# Patient Record
Sex: Female | Born: 1975
Health system: Southern US, Community
[De-identification: ages and names within clinical notes are randomized; demographics above are authoritative.]

## PROBLEM LIST (undated history)

## (undated) DIAGNOSIS — Z950 Presence of cardiac pacemaker: Secondary | ICD-10-CM

## (undated) DIAGNOSIS — M519 Unspecified thoracic, thoracolumbar and lumbosacral intervertebral disc disorder: Secondary | ICD-10-CM

## (undated) HISTORY — DX: Presence of cardiac pacemaker: Z95.0

## (undated) HISTORY — DX: Unspecified thoracic, thoracolumbar and lumbosacral intervertebral disc disorder: M51.9

## (undated) HISTORY — PX: MOHS SURGERY: SUR867

---

## 1998-10-26 ENCOUNTER — Other Ambulatory Visit: Admission: RE | Admit: 1998-10-26 | Discharge: 1998-10-26 | Payer: Self-pay | Admitting: Gynecology

## 1999-10-04 ENCOUNTER — Other Ambulatory Visit: Admission: RE | Admit: 1999-10-04 | Discharge: 1999-10-04 | Payer: Self-pay | Admitting: Gynecology

## 2000-10-11 ENCOUNTER — Other Ambulatory Visit: Admission: RE | Admit: 2000-10-11 | Discharge: 2000-10-11 | Payer: Self-pay | Admitting: Gynecology

## 2001-10-06 ENCOUNTER — Other Ambulatory Visit: Admission: RE | Admit: 2001-10-06 | Discharge: 2001-10-06 | Payer: Self-pay | Admitting: Gynecology

## 2002-10-22 ENCOUNTER — Other Ambulatory Visit: Admission: RE | Admit: 2002-10-22 | Discharge: 2002-10-22 | Payer: Self-pay | Admitting: Gynecology

## 2003-11-09 ENCOUNTER — Other Ambulatory Visit: Admission: RE | Admit: 2003-11-09 | Discharge: 2003-11-09 | Payer: Self-pay | Admitting: Gynecology

## 2004-11-09 ENCOUNTER — Other Ambulatory Visit: Admission: RE | Admit: 2004-11-09 | Discharge: 2004-11-09 | Payer: Self-pay | Admitting: Gynecology

## 2006-03-06 ENCOUNTER — Other Ambulatory Visit: Admission: RE | Admit: 2006-03-06 | Discharge: 2006-03-06 | Payer: Self-pay | Admitting: Gynecology

## 2011-07-27 ENCOUNTER — Ambulatory Visit (HOSPITAL_COMMUNITY)
Admission: RE | Admit: 2011-07-27 | Discharge: 2011-07-27 | Disposition: A | Discharge status: Home / Self Care | Payer: BC Managed Care – PPO | Source: Ambulatory Visit | Attending: Orthopedic Surgery | Admitting: Orthopedic Surgery

## 2011-07-27 ENCOUNTER — Other Ambulatory Visit: Payer: Self-pay | Admitting: Orthopedic Surgery

## 2011-07-27 ENCOUNTER — Ambulatory Visit (HOSPITAL_COMMUNITY): Admission: RE | Admit: 2011-07-27 | Payer: BC Managed Care – PPO | Source: Ambulatory Visit

## 2011-07-27 DIAGNOSIS — R52 Pain, unspecified: Secondary | ICD-10-CM

## 2011-07-27 DIAGNOSIS — M25579 Pain in unspecified ankle and joints of unspecified foot: Secondary | ICD-10-CM | POA: Insufficient documentation

## 2013-03-24 ENCOUNTER — Ambulatory Visit (HOSPITAL_COMMUNITY)
Admission: RE | Admit: 2013-03-24 | Discharge: 2013-03-24 | Disposition: A | Discharge status: Home / Self Care | Payer: 59 | Source: Ambulatory Visit | Attending: Podiatry | Admitting: Podiatry

## 2013-03-24 DIAGNOSIS — M25673 Stiffness of unspecified ankle, not elsewhere classified: Secondary | ICD-10-CM | POA: Insufficient documentation

## 2013-03-24 DIAGNOSIS — M25579 Pain in unspecified ankle and joints of unspecified foot: Secondary | ICD-10-CM | POA: Insufficient documentation

## 2013-03-24 DIAGNOSIS — M6281 Muscle weakness (generalized): Secondary | ICD-10-CM | POA: Insufficient documentation

## 2013-03-24 DIAGNOSIS — M25676 Stiffness of unspecified foot, not elsewhere classified: Secondary | ICD-10-CM | POA: Insufficient documentation

## 2013-03-24 DIAGNOSIS — M6790 Unspecified disorder of synovium and tendon, unspecified site: Secondary | ICD-10-CM | POA: Insufficient documentation

## 2013-03-24 DIAGNOSIS — IMO0001 Reserved for inherently not codable concepts without codable children: Secondary | ICD-10-CM | POA: Insufficient documentation

## 2013-03-24 NOTE — Evaluation (Signed)
Physical Therapy Evaluation  Patient Details  Name: Latasha Allen MRN: [id number] Date of Birth: 05-29-1976 Charge:  evaluation Today's Date: 03/24/2013 Time: 7829-5621 PT Time Calculation (min): 40 min              Visit#: 1 of 8  Re-eval: 04/23/13 Assessment Diagnosis: B plantar fascitis Prior Therapy: none  Authorization:      Authorization Time Period:    Authorization Visit#:   of     Past Medical History: No past medical history on file. Past Surgical History: No past surgical history on file.  Subjective Symptoms/Limitations Symptoms: Ms. Meares states that she has been having plantar fasciitis off and on for over a year.  She states that both of her feet are sore all over.  She has noticed swelling but does not wear compression garment. She states that the pain is worst in the mornig and if she stands in one position.  Pt states that she has been injected three times with tempporary relief that lasts for about two months. How long can you stand comfortably?: pain increases after 20-30 minues  Pain Assessment Currently in Pain?: Yes Pain Score:   4 (worst pain is at 7/10; best 2/10) Pain Location: Foot Pain Orientation: Right;Left Pain Relieving Factors: nothing Effect of Pain on Daily Activities: increases pain  Balance Screening  no falls   Sensation/Coordination/Flexibility/Functional Tests  LEFS 72/80  Assessment RLE AROM (degrees) Right Ankle Dorsiflexion: 5 Right Ankle Plantar Flexion:  (wnl) Right Ankle Inversion:  (wnl) Right Ankle Eversion:  (wnl) RLE Strength Right Ankle Dorsiflexion: 5/5 LLE AROM (degrees) Left Ankle Dorsiflexion: 10 Left Ankle Plantar Flexion:  (wnl) Left Ankle Inversion:  (wnl) Left Ankle Eversion:  (wnl)  Exercise/Treatments   Ankle Stretches Plantar Fascia Stretch: 3 reps;30 seconds Gastroc Stretch: 3 reps;30 seconds   Manual Therapy Manual Therapy: Myofascial release Myofascial Release: B gastro/soleus complex as  well as plantar aspect of feet.  Physical Therapy Assessment and Plan PT Assessment and Plan Clinical Impression Statement: Patient with chronic plantarfascitis B who has been referred to PT to help to decrease sx of pain.   Exam reveals decreased dorsiflexion B as well as fascial restrictions in B gastroc/soleus complex.  Pt will benefit from skilled PT to increase ROM, decrease pain and return pt to prior functional level. Rehab Potential: Good PT Frequency: Min 2X/week PT Treatment/Interventions: Therapeutic exercise;Manual techniques;Modalities PT Plan: begin [place] to B gastroc soleus tendon.    Goals Home Exercise Program Pt will Perform Home Exercise Program: Independently PT Short Term Goals Time to Complete Short Term Goals: 2 weeks PT Short Term Goal 1: Pain no higher than a 4/10 PT Short Term Goal 2: dorsiflexion wnl PT Short Term Goal 3: Pt to be able to stand for 90 minutes without increased pain PT Long Term Goals Time to Complete Long Term Goals: 4 weeks PT Long Term Goal 1: Pain no higher than a 1/10 PT Long Term Goal 2: Pt to be able to stand for 3 hours without increased pain  Problem List Patient Active Problem List   Diagnosis Date Noted  . Pain in joint, ankle and foot 03/24/2013  . Tendon dysfunction 03/24/2013    PT Plan of Care PT Home Exercise Plan: GIven  GP    Maison Agrusa,CINDY 03/24/2013, 4:22 PM  Physician Documentation Your signature is required to indicate approval of the treatment plan as stated above.  Please sign and either send electronically or make a copy of this report for your  files and return this physician signed original.   Please mark one 1.__approve of plan  2. ___approve of plan with the following conditions.   ______________________________                                                          _____________________ Physician Signature                                                                                                              Date

## 2013-04-01 ENCOUNTER — Ambulatory Visit (HOSPITAL_COMMUNITY)
Admission: RE | Admit: 2013-04-01 | Discharge: 2013-04-01 | Disposition: A | Discharge status: Home / Self Care | Payer: 59 | Source: Ambulatory Visit | Attending: Podiatry | Admitting: Podiatry

## 2013-04-01 DIAGNOSIS — M6790 Unspecified disorder of synovium and tendon, unspecified site: Secondary | ICD-10-CM

## 2013-04-01 NOTE — Progress Notes (Signed)
Physical Therapy Treatment Patient Details  Name: Latasha Allen MRN: [id number] Date of Birth: 1976-07-30  Today's Date: 04/01/2013 Time: 1530-1610 PT Time Calculation (min): 40 min  Visit#: 2 of 8  Re-eval: 04/23/13    Authorization: UMR  Subjective: Symptoms/Limitations Symptoms: Latasha Allen has acquired her compression stockings and feel that they may be helping somewhat.   Pain Assessment Currently in Pain?: Yes Pain Score:   4 Pain Location: Foot Pain Orientation: Right;Left Pain Onset: More than a month ago Pain Frequency: Constant  Precautions/Restrictions     Exercise/Treatments Ankle Stretches Soleus Stretch: 3 reps;30 seconds Other Stretch: slant board 3x 30 "   Modalities Modalities: Ultrasound Manual Therapy Manual Therapy: Myofascial release Myofascial Release: B gastroc soleus complex as well as plantar aspect of feet to decrease adhesions to decrease pain. Ultrasound Ultrasound Location: B Gastroc tendon  Ultrasound Parameters: 3 MHZ 1.2 w/cm2 x 5' each Ultrasound Goals: Pain (increase circulation)  Physical Therapy Assessment and Plan PT Assessment and Plan Clinical Impression Statement: Pt with improving ROM and pain.  Added [place] to improve pain and circulation to tendon. PT Frequency: Min 2X/week PT Treatment/Interventions: Therapeutic exercise;Manual techniques;Modalities PT Plan: Assess how [place] did; may change to laser if pt is not seeing results.    Goals    Problem List Patient Active Problem List   Diagnosis Date Noted  . Pain in joint, ankle and foot 03/24/2013  . Tendon dysfunction 03/24/2013       GP    RUSSELL,CINDY 04/01/2013, 5:28 PM

## 2013-04-03 ENCOUNTER — Ambulatory Visit (HOSPITAL_COMMUNITY)
Admission: RE | Admit: 2013-04-03 | Discharge: 2013-04-03 | Disposition: A | Discharge status: Home / Self Care | Payer: 59 | Source: Ambulatory Visit | Attending: Podiatry | Admitting: Podiatry

## 2013-04-03 DIAGNOSIS — M6790 Unspecified disorder of synovium and tendon, unspecified site: Secondary | ICD-10-CM

## 2013-04-03 NOTE — Progress Notes (Signed)
Physical Therapy Treatment Patient Details  Name: Latasha Allen MRN: [id number] Date of Birth: 01/22/1976  Today's Date: 04/03/2013 Time: 1510-1607 PT Time Calculation (min): 57 min  Visit#: 3 of 8  Re-eval: 04/23/13  charges:  There ex 3:10 -316; Manual 3:16- 3:36; [place] 3:38-4:04  Authorization: UMR  Subjective: Symptoms/Limitations Symptoms: Pt states that she had temporary relief from last treatment.   Pain Assessment Pain Score:   3 Pain Location: Foot Pain Orientation: Right;Left Pain Onset: More than a month ago    Exercise/Treatments  Ankle Stretches Plantar Fascia Stretch: 3 reps;30 seconds Other Stretch: slant board 3x 30 "   Modalities Modalities: Ultrasound Manual Therapy Manual Therapy: Myofascial release Myofascial Release: manual and hawks tools used to decrease adhesion in gastroc/soleus complex. Ultrasound Ultrasound Parameters: at 1.2 w/cm2 x 8" each. Ultrasound Goals: Pain  Physical Therapy Assessment and Plan PT Assessment and Plan Clinical Impression Statement: Pt continues to have pain in the plantar aspect of B feet. States she feels that the gastroc mm itself is not as tender as it has been.  Rehab Potential: Good PT Frequency: Min 2X/week PT Treatment/Interventions: Therapeutic exercise;Manual techniques;Modalities PT Plan: Coninue one more treatment of [place] prior to deciding to try another modality.    Goals  progressing  Problem List Patient Active Problem List   Diagnosis Date Noted  . Pain in joint, ankle and foot 03/24/2013  . Tendon dysfunction 03/24/2013       GP    RUSSELL,CINDY 04/03/2013, 4:23 PM

## 2013-04-08 ENCOUNTER — Ambulatory Visit (HOSPITAL_COMMUNITY)
Admission: RE | Admit: 2013-04-08 | Discharge: 2013-04-08 | Disposition: A | Discharge status: Home / Self Care | Payer: 59 | Source: Ambulatory Visit | Attending: Podiatry | Admitting: Podiatry

## 2013-04-08 DIAGNOSIS — M25579 Pain in unspecified ankle and joints of unspecified foot: Secondary | ICD-10-CM | POA: Insufficient documentation

## 2013-04-08 DIAGNOSIS — M6790 Unspecified disorder of synovium and tendon, unspecified site: Secondary | ICD-10-CM

## 2013-04-08 DIAGNOSIS — M6281 Muscle weakness (generalized): Secondary | ICD-10-CM | POA: Insufficient documentation

## 2013-04-08 DIAGNOSIS — M25673 Stiffness of unspecified ankle, not elsewhere classified: Secondary | ICD-10-CM | POA: Insufficient documentation

## 2013-04-08 DIAGNOSIS — M25676 Stiffness of unspecified foot, not elsewhere classified: Secondary | ICD-10-CM | POA: Insufficient documentation

## 2013-04-08 DIAGNOSIS — IMO0001 Reserved for inherently not codable concepts without codable children: Secondary | ICD-10-CM | POA: Insufficient documentation

## 2013-04-08 NOTE — Progress Notes (Signed)
Physical Therapy Treatment Patient Details  Name: Latasha Allen MRN: [id number] Date of Birth: 1976-01-14  Today's Date: 04/08/2013 Time: 4098-1191 PT Time Calculation (min): 50 min Charge:Therex 12' [phone]), [place] 21' [phone]), Manual 21' [phone])  Visit#: 4 of 8  Re-eval: 04/23/13   Subjective: Symptoms/Limitations Symptoms: Pt stated she is finding increased pain relief following [place], was on feet alot yesterday and today.   Pain Assessment Currently in Pain?: Yes Pain Score:   6 Pain Location: Foot Pain Orientation: Right;Left  Objective:    Exercise/Treatments Ankle Stretches Plantar Fascia Stretch: 3 reps;30 seconds Other Stretch: slant board 3x 30 "    Modalities Modalities: Ultrasound Manual Therapy Manual Therapy: Myofascial release Myofascial Release: Manual and hawk grips tools used to decrease adhesions in plantar surface, gastoc and soleus complex. Ultrasound Ultrasound Location: B gastroc tendon, achillles and plantar surface Ultrasound Parameters: at 1.2 w/cm2 continuous x 8" each (16' total) Ultrasound Goals: Pain  Physical Therapy Assessment and Plan PT Assessment and Plan Clinical Impression Statement: Continued with [place] for pain relief and manual techniques to reduce tightness with significant reduction in fascial restrictions especially Rt plantar surface and Lt medial gastrocnemius.  Pt educated on importance of stretches with plantarfascia and encouiraged to increase frequency of stretches at home for maximum benefits/ PT Plan: Assess pain relief following this session, f/u with [place] to decide if needed to try another modality.    Goals    Problem List Patient Active Problem List   Diagnosis Date Noted  . Pain in joint, ankle and foot 03/24/2013  . Tendon dysfunction 03/24/2013    PT - End of Session Activity Tolerance: Patient tolerated treatment well General Behavior During Therapy: WFL for tasks assessed/performed Cognition:  WFL for tasks performed PT Plan of Care PT Patient Instructions: Explained importance and encouraged increased frequency of stretches.    GP    Juel Burrow 04/08/2013, 5:10 PM

## 2013-04-10 ENCOUNTER — Ambulatory Visit (HOSPITAL_COMMUNITY)
Admission: RE | Admit: 2013-04-10 | Discharge: 2013-04-10 | Disposition: A | Discharge status: Home / Self Care | Payer: 59 | Source: Ambulatory Visit | Attending: Podiatry | Admitting: Podiatry

## 2013-04-10 DIAGNOSIS — M6790 Unspecified disorder of synovium and tendon, unspecified site: Secondary | ICD-10-CM

## 2013-04-10 NOTE — Progress Notes (Signed)
Physical Therapy Treatment Patient Details  Name: Latasha Allen MRN: [id number] Date of Birth: Oct 07, 1976  Today's Date: 04/10/2013 Time: 6578-4696 PT Time Calculation (min): 43 min  Visit#: 5 of 8  Re-eval: 04/23/13  charge:  There ex [id number]; [place] [id number]; ;manual 1400-1413.  Authorization: UMR   Subjective: Symptoms/Limitations Symptoms: Pt has no pain today Pain Assessment Currently in Pain?: No/denies Pain Score: 0-No pain  Exercise/Treatments   Ankle Stretches Plantar Fascia Stretch: 3 reps;30 seconds Other Stretch: slant board 3x 30 " Other Stretch: foot against wall x 30 sec Modalities Modalities: Ultrasound Manual Therapy Manual Therapy: Myofascial release Myofascial Release: manual and hawk grips tools used to decrease adhesions in plantar surface/gastroc. Ultrasound Ultrasound Location: B gastoc tendon while on stretch to increase flexibility Ultrasound Parameters: at 1.2 w/cm2 x 8 min each  Physical Therapy Assessment and Plan PT Assessment and Plan Clinical Impression Statement: Pt with decreased tightness and adhesions this session. Pt continues to be encouraged to increase the frequency of stretching Rehab Potential: Good PT Plan: Pt continues to improve.  Continue with plan of care.    Goals  progressing  Problem List Patient Active Problem List   Diagnosis Date Noted  . Pain in joint, ankle and foot 03/24/2013  . Tendon dysfunction 03/24/2013    PT - End of Session Activity Tolerance: Patient tolerated treatment well General Behavior During Therapy: [facility] for tasks assessed/performed Cognition: WFL for tasks performed  GP    RUSSELL,CINDY 04/10/2013, 4:52 PM

## 2013-04-13 ENCOUNTER — Inpatient Hospital Stay (HOSPITAL_COMMUNITY): Admission: RE | Admit: 2013-04-13 | Payer: BC Managed Care – PPO | Source: Ambulatory Visit | Admitting: *Deleted

## 2013-04-15 ENCOUNTER — Ambulatory Visit (HOSPITAL_COMMUNITY)
Admission: RE | Admit: 2013-04-15 | Discharge: 2013-04-15 | Disposition: A | Discharge status: Home / Self Care | Payer: 59 | Source: Ambulatory Visit | Attending: Podiatry | Admitting: Podiatry

## 2013-04-15 NOTE — Progress Notes (Signed)
Physical Therapy Treatment Patient Details  Name: Latasha Allen MRN: [id number] Date of Birth: 23-May-1976  Today's Date: 04/15/2013 Time: 1520-1600 PT Time Calculation (min): 40 min  Visit#: 6 of 8  Re-eval: 04/23/13 Charges: Therex x 10' Manual x 12' Ultrasound x 16'  Authorization: UMR    Subjective: Symptoms/Limitations Symptoms: Pt states that her pain is decreasing. Pain Assessment Currently in Pain?: Yes Pain Score:   5 Pain Location: Foot Pain Orientation: Right;Left   Exercise/Treatments Ankle Stretches Plantar Fascia Stretch: 3 reps;30 seconds Other Stretch: slant board 3x 30 "   Modalities Modalities: Ultrasound Manual Therapy Manual Therapy: Other (comment) Myofascial Release: to bilateral plantar surface/gastroc Other Manual Therapy: Strain counter strain to bilateral gastrocs  Ultrasound Ultrasound Location: B gastoc tendon while on stretch to increase flexibility Ultrasound Parameters: at 1.2 w/cm2 x 8 min  Ultrasound Goals: Pain  Physical Therapy Assessment and Plan PT Assessment and Plan Clinical Impression Statement: Decreased tightness and adhesions noted in BLE. Right gastroc presents with greater tightness that left. Completed strain counter strain to bilateral gastrocs to decrease tenderness and spasms. Pt reports pain decrease to 2/10 at end of session. Rehab Potential: Good PT Plan: Continue with modalities and manual technique to decrease pain and tightness.     Problem List Patient Active Problem List   Diagnosis Date Noted  . Pain in joint, ankle and foot 03/24/2013  . Tendon dysfunction 03/24/2013    PT - End of Session Activity Tolerance: Patient tolerated treatment well General Behavior During Therapy: [facility] for tasks assessed/performed Cognition: [facility] for tasks performed  Seth Bake, PTA  04/15/2013, 5:51 PM

## 2013-04-16 ENCOUNTER — Ambulatory Visit (HOSPITAL_COMMUNITY)
Admission: RE | Admit: 2013-04-16 | Discharge: 2013-04-16 | Disposition: A | Discharge status: Home / Self Care | Payer: 59 | Source: Ambulatory Visit | Attending: Podiatry | Admitting: Podiatry

## 2013-04-16 DIAGNOSIS — M6790 Unspecified disorder of synovium and tendon, unspecified site: Secondary | ICD-10-CM

## 2013-04-16 DIAGNOSIS — M25572 Pain in left ankle and joints of left foot: Secondary | ICD-10-CM

## 2013-04-16 NOTE — Progress Notes (Signed)
Physical Therapy Treatment Patient Details  Name: Latasha Allen MRN: [id number] Date of Birth: 1976/07/12  Today's Date: 04/16/2013 Time: 4098-1191 PT Time Calculation (min): 47 min Charges: TE: 4782-9562 [place]: 1308-6578 Manual: 4696-2952 Visit#: 7 of 8  Re-eval: 04/23/13    Authorization: UMR  Authorization Time Period:    Authorization Visit#:   of     Subjective: Symptoms/Limitations Symptoms: Pt rpeorts that she is happy with the progress.  Feels that her Rt leg is worse than her Lt.  Pain Assessment Currently in Pain?: Yes Pain Location: Foot Pain Orientation: Right;Left  Precautions/Restrictions     Exercise/Treatments Ankle Stretches Plantar Fascia Stretch: 3 reps;30 seconds Other Stretch: slant board 3x 30 " before manual, 2x60 sec after manual   Modalities Modalities: Ultrasound Manual Therapy Myofascial Release: to bilateral plantar surface/gastroc to improve fascial mobility  Other Manual Therapy: Strain counter strain to bilateral gastrocs  Ultrasound Ultrasound Location: Bil plantar fascia insertion  Ultrasound Parameters: (medium head) at 1.2 w/cm2 x 8 min (4 min Rt/4 min Lt)  Physical Therapy Assessment and Plan PT Assessment and Plan Clinical Impression Statement: Pt continues to improve her overall fascial mobility to her LLE and is still limited by fascial restrictions and muscle spasms to RLE.  Added stretching after manual therapy to improve fascial mobility to decrease overall pain and improve function.  Pt continues to progress towards functional goals and is standing longer with less pain at work.  Rehab Potential: Good PT Plan: Re-eval next visit    Goals Home Exercise Program Pt will Perform Home Exercise Program: Independently PT Short Term Goals Time to Complete Short Term Goals: 2 weeks PT Short Term Goal 1: Pain no higher than a 4/10 PT Short Term Goal 1 - Progress: Progressing toward goal PT Short Term Goal 2: dorsiflexion  wnl PT Short Term Goal 2 - Progress: Met PT Short Term Goal 3: Pt to be able to stand for 90 minutes without increased pain PT Short Term Goal 3 - Progress: Progressing toward goal PT Long Term Goals Time to Complete Long Term Goals: 4 weeks PT Long Term Goal 1: Pain no higher than a 1/10 PT Long Term Goal 1 - Progress: Progressing toward goal PT Long Term Goal 2: Pt to be able to stand for 3 hours without increased pain PT Long Term Goal 2 - Progress: Progressing toward goal  Problem List Patient Active Problem List   Diagnosis Date Noted  . Pain in joint, ankle and foot 03/24/2013  . Tendon dysfunction 03/24/2013    PT - End of Session Activity Tolerance: Patient tolerated treatment well General Behavior During Therapy: [facility] for tasks assessed/performed Cognition: WFL for tasks performed  GP    Riona Lahti 04/16/2013, 3:17 PM

## 2015-11-10 DIAGNOSIS — R635 Abnormal weight gain: Secondary | ICD-10-CM | POA: Diagnosis not present

## 2015-11-10 DIAGNOSIS — N951 Menopausal and female climacteric states: Secondary | ICD-10-CM | POA: Diagnosis not present

## 2015-11-10 DIAGNOSIS — H9201 Otalgia, right ear: Secondary | ICD-10-CM | POA: Diagnosis not present

## 2015-11-16 DIAGNOSIS — E7800 Pure hypercholesterolemia, unspecified: Secondary | ICD-10-CM | POA: Diagnosis not present

## 2015-11-16 DIAGNOSIS — E559 Vitamin D deficiency, unspecified: Secondary | ICD-10-CM | POA: Diagnosis not present

## 2015-11-16 DIAGNOSIS — E669 Obesity, unspecified: Secondary | ICD-10-CM | POA: Diagnosis not present

## 2015-11-16 DIAGNOSIS — E538 Deficiency of other specified B group vitamins: Secondary | ICD-10-CM | POA: Diagnosis not present

## 2015-11-21 DIAGNOSIS — E538 Deficiency of other specified B group vitamins: Secondary | ICD-10-CM | POA: Diagnosis not present

## 2015-11-21 DIAGNOSIS — E559 Vitamin D deficiency, unspecified: Secondary | ICD-10-CM | POA: Diagnosis not present

## 2015-11-21 DIAGNOSIS — E669 Obesity, unspecified: Secondary | ICD-10-CM | POA: Diagnosis not present

## 2015-11-21 DIAGNOSIS — E7800 Pure hypercholesterolemia, unspecified: Secondary | ICD-10-CM | POA: Diagnosis not present

## 2015-11-28 DIAGNOSIS — E538 Deficiency of other specified B group vitamins: Secondary | ICD-10-CM | POA: Diagnosis not present

## 2015-11-28 DIAGNOSIS — E669 Obesity, unspecified: Secondary | ICD-10-CM | POA: Diagnosis not present

## 2015-11-28 DIAGNOSIS — E7800 Pure hypercholesterolemia, unspecified: Secondary | ICD-10-CM | POA: Diagnosis not present

## 2015-11-28 DIAGNOSIS — E559 Vitamin D deficiency, unspecified: Secondary | ICD-10-CM | POA: Diagnosis not present

## 2015-12-05 DIAGNOSIS — E559 Vitamin D deficiency, unspecified: Secondary | ICD-10-CM | POA: Diagnosis not present

## 2015-12-05 DIAGNOSIS — E538 Deficiency of other specified B group vitamins: Secondary | ICD-10-CM | POA: Diagnosis not present

## 2015-12-05 DIAGNOSIS — E669 Obesity, unspecified: Secondary | ICD-10-CM | POA: Diagnosis not present

## 2015-12-05 DIAGNOSIS — E7800 Pure hypercholesterolemia, unspecified: Secondary | ICD-10-CM | POA: Diagnosis not present

## 2015-12-07 MED FILL — FLUoxetine HCL 40 MG CAPS: 40 | 90 days supply | Qty: 90 | Fill #1

## 2015-12-12 DIAGNOSIS — E559 Vitamin D deficiency, unspecified: Secondary | ICD-10-CM | POA: Diagnosis not present

## 2015-12-12 DIAGNOSIS — E669 Obesity, unspecified: Secondary | ICD-10-CM | POA: Diagnosis not present

## 2015-12-12 DIAGNOSIS — E7800 Pure hypercholesterolemia, unspecified: Secondary | ICD-10-CM | POA: Diagnosis not present

## 2015-12-12 DIAGNOSIS — E538 Deficiency of other specified B group vitamins: Secondary | ICD-10-CM | POA: Diagnosis not present

## 2015-12-19 DIAGNOSIS — E669 Obesity, unspecified: Secondary | ICD-10-CM | POA: Diagnosis not present

## 2015-12-19 DIAGNOSIS — E7800 Pure hypercholesterolemia, unspecified: Secondary | ICD-10-CM | POA: Diagnosis not present

## 2015-12-19 DIAGNOSIS — E538 Deficiency of other specified B group vitamins: Secondary | ICD-10-CM | POA: Diagnosis not present

## 2015-12-19 DIAGNOSIS — E559 Vitamin D deficiency, unspecified: Secondary | ICD-10-CM | POA: Diagnosis not present

## 2015-12-27 DIAGNOSIS — E538 Deficiency of other specified B group vitamins: Secondary | ICD-10-CM | POA: Diagnosis not present

## 2015-12-27 DIAGNOSIS — E669 Obesity, unspecified: Secondary | ICD-10-CM | POA: Diagnosis not present

## 2015-12-27 DIAGNOSIS — E559 Vitamin D deficiency, unspecified: Secondary | ICD-10-CM | POA: Diagnosis not present

## 2015-12-27 DIAGNOSIS — E7800 Pure hypercholesterolemia, unspecified: Secondary | ICD-10-CM | POA: Diagnosis not present

## 2016-01-02 DIAGNOSIS — E7800 Pure hypercholesterolemia, unspecified: Secondary | ICD-10-CM | POA: Diagnosis not present

## 2016-01-02 DIAGNOSIS — E663 Overweight: Secondary | ICD-10-CM | POA: Diagnosis not present

## 2016-01-02 DIAGNOSIS — E538 Deficiency of other specified B group vitamins: Secondary | ICD-10-CM | POA: Diagnosis not present

## 2016-01-02 DIAGNOSIS — E559 Vitamin D deficiency, unspecified: Secondary | ICD-10-CM | POA: Diagnosis not present

## 2016-01-09 DIAGNOSIS — E538 Deficiency of other specified B group vitamins: Secondary | ICD-10-CM | POA: Diagnosis not present

## 2016-01-09 DIAGNOSIS — E7800 Pure hypercholesterolemia, unspecified: Secondary | ICD-10-CM | POA: Diagnosis not present

## 2016-01-09 DIAGNOSIS — E669 Obesity, unspecified: Secondary | ICD-10-CM | POA: Diagnosis not present

## 2016-01-09 DIAGNOSIS — E559 Vitamin D deficiency, unspecified: Secondary | ICD-10-CM | POA: Diagnosis not present

## 2016-01-16 DIAGNOSIS — E538 Deficiency of other specified B group vitamins: Secondary | ICD-10-CM | POA: Diagnosis not present

## 2016-01-16 DIAGNOSIS — E663 Overweight: Secondary | ICD-10-CM | POA: Diagnosis not present

## 2016-01-16 DIAGNOSIS — E7800 Pure hypercholesterolemia, unspecified: Secondary | ICD-10-CM | POA: Diagnosis not present

## 2016-01-23 DIAGNOSIS — E7800 Pure hypercholesterolemia, unspecified: Secondary | ICD-10-CM | POA: Diagnosis not present

## 2016-01-23 DIAGNOSIS — E559 Vitamin D deficiency, unspecified: Secondary | ICD-10-CM | POA: Diagnosis not present

## 2016-01-23 DIAGNOSIS — E538 Deficiency of other specified B group vitamins: Secondary | ICD-10-CM | POA: Diagnosis not present

## 2016-01-31 DIAGNOSIS — E7800 Pure hypercholesterolemia, unspecified: Secondary | ICD-10-CM | POA: Diagnosis not present

## 2016-01-31 DIAGNOSIS — E669 Obesity, unspecified: Secondary | ICD-10-CM | POA: Diagnosis not present

## 2016-01-31 DIAGNOSIS — E559 Vitamin D deficiency, unspecified: Secondary | ICD-10-CM | POA: Diagnosis not present

## 2016-01-31 DIAGNOSIS — E538 Deficiency of other specified B group vitamins: Secondary | ICD-10-CM | POA: Diagnosis not present

## 2016-02-06 DIAGNOSIS — E663 Overweight: Secondary | ICD-10-CM | POA: Diagnosis not present

## 2016-02-06 DIAGNOSIS — E559 Vitamin D deficiency, unspecified: Secondary | ICD-10-CM | POA: Diagnosis not present

## 2016-02-06 DIAGNOSIS — E538 Deficiency of other specified B group vitamins: Secondary | ICD-10-CM | POA: Diagnosis not present

## 2016-02-06 DIAGNOSIS — E7800 Pure hypercholesterolemia, unspecified: Secondary | ICD-10-CM | POA: Diagnosis not present

## 2016-02-13 DIAGNOSIS — E663 Overweight: Secondary | ICD-10-CM | POA: Diagnosis not present

## 2016-02-13 DIAGNOSIS — E7800 Pure hypercholesterolemia, unspecified: Secondary | ICD-10-CM | POA: Diagnosis not present

## 2016-02-13 DIAGNOSIS — E538 Deficiency of other specified B group vitamins: Secondary | ICD-10-CM | POA: Diagnosis not present

## 2016-02-13 DIAGNOSIS — E559 Vitamin D deficiency, unspecified: Secondary | ICD-10-CM | POA: Diagnosis not present

## 2016-02-13 DIAGNOSIS — R635 Abnormal weight gain: Secondary | ICD-10-CM | POA: Diagnosis not present

## 2016-02-13 DIAGNOSIS — N951 Menopausal and female climacteric states: Secondary | ICD-10-CM | POA: Diagnosis not present

## 2016-02-21 DIAGNOSIS — E7800 Pure hypercholesterolemia, unspecified: Secondary | ICD-10-CM | POA: Diagnosis not present

## 2016-02-21 DIAGNOSIS — E559 Vitamin D deficiency, unspecified: Secondary | ICD-10-CM | POA: Diagnosis not present

## 2016-02-21 DIAGNOSIS — E663 Overweight: Secondary | ICD-10-CM | POA: Diagnosis not present

## 2016-03-26 DIAGNOSIS — E559 Vitamin D deficiency, unspecified: Secondary | ICD-10-CM | POA: Diagnosis not present

## 2016-03-26 DIAGNOSIS — E663 Overweight: Secondary | ICD-10-CM | POA: Diagnosis not present

## 2016-03-26 DIAGNOSIS — E7800 Pure hypercholesterolemia, unspecified: Secondary | ICD-10-CM | POA: Diagnosis not present

## 2016-04-18 MED FILL — FLUoxetine HCL 40 MG CAPS: 40 | 90 days supply | Qty: 90 | Fill #2

## 2016-07-10 DIAGNOSIS — L570 Actinic keratosis: Secondary | ICD-10-CM | POA: Diagnosis not present

## 2016-07-10 DIAGNOSIS — D229 Melanocytic nevi, unspecified: Secondary | ICD-10-CM

## 2016-07-10 DIAGNOSIS — D485 Neoplasm of uncertain behavior of skin: Secondary | ICD-10-CM | POA: Diagnosis not present

## 2016-07-10 DIAGNOSIS — D239 Other benign neoplasm of skin, unspecified: Secondary | ICD-10-CM | POA: Diagnosis not present

## 2016-07-10 HISTORY — DX: Melanocytic nevi, unspecified: D22.9

## 2016-08-02 DIAGNOSIS — D485 Neoplasm of uncertain behavior of skin: Secondary | ICD-10-CM | POA: Diagnosis not present

## 2016-08-09 MED FILL — FLUoxetine HCL 40 MG CAPS: 40 | 90 days supply | Qty: 90 | Fill #0

## 2016-09-06 DIAGNOSIS — C44319 Basal cell carcinoma of skin of other parts of face: Secondary | ICD-10-CM | POA: Diagnosis not present

## 2016-09-06 DIAGNOSIS — D485 Neoplasm of uncertain behavior of skin: Secondary | ICD-10-CM | POA: Diagnosis not present

## 2016-09-06 DIAGNOSIS — C4491 Basal cell carcinoma of skin, unspecified: Secondary | ICD-10-CM

## 2016-09-06 HISTORY — DX: Basal cell carcinoma of skin, unspecified: C44.91

## 2016-11-30 DIAGNOSIS — M722 Plantar fascial fibromatosis: Secondary | ICD-10-CM | POA: Diagnosis not present

## 2016-11-30 DIAGNOSIS — D3610 Benign neoplasm of peripheral nerves and autonomic nervous system, unspecified: Secondary | ICD-10-CM | POA: Diagnosis not present

## 2016-12-06 DIAGNOSIS — D485 Neoplasm of uncertain behavior of skin: Secondary | ICD-10-CM | POA: Diagnosis not present

## 2016-12-06 DIAGNOSIS — C44319 Basal cell carcinoma of skin of other parts of face: Secondary | ICD-10-CM | POA: Diagnosis not present

## 2016-12-06 DIAGNOSIS — L988 Other specified disorders of the skin and subcutaneous tissue: Secondary | ICD-10-CM | POA: Diagnosis not present

## 2016-12-17 MED FILL — FLUoxetine HCL 40 MG CAPS: 40 | 90 days supply | Qty: 90 | Fill #1

## 2017-03-05 DIAGNOSIS — L738 Other specified follicular disorders: Secondary | ICD-10-CM | POA: Diagnosis not present

## 2017-03-05 DIAGNOSIS — C44319 Basal cell carcinoma of skin of other parts of face: Secondary | ICD-10-CM | POA: Diagnosis not present

## 2017-03-05 DIAGNOSIS — Z85828 Personal history of other malignant neoplasm of skin: Secondary | ICD-10-CM | POA: Diagnosis not present

## 2017-03-11 DIAGNOSIS — J029 Acute pharyngitis, unspecified: Secondary | ICD-10-CM | POA: Diagnosis not present

## 2017-03-11 DIAGNOSIS — Z6831 Body mass index (BMI) 31.0-31.9, adult: Secondary | ICD-10-CM | POA: Diagnosis not present

## 2017-03-28 MED FILL — FLUoxetine HCL 40 MG CAPS: 40 | 90 days supply | Qty: 90 | Fill #2

## 2017-06-19 DIAGNOSIS — C44319 Basal cell carcinoma of skin of other parts of face: Secondary | ICD-10-CM | POA: Diagnosis not present

## 2017-08-01 MED FILL — FLUoxetine HCL 40 MG CAPS: 40 | 90 days supply | Qty: 90 | Fill #0

## 2017-11-12 ENCOUNTER — Other Ambulatory Visit (HOSPITAL_COMMUNITY): Payer: Self-pay | Admitting: Family Medicine

## 2017-11-12 DIAGNOSIS — Z1231 Encounter for screening mammogram for malignant neoplasm of breast: Secondary | ICD-10-CM

## 2017-11-14 ENCOUNTER — Encounter (HOSPITAL_COMMUNITY): Payer: Self-pay | Admitting: Radiology

## 2017-11-14 ENCOUNTER — Ambulatory Visit (HOSPITAL_COMMUNITY)
Admission: RE | Admit: 2017-11-14 | Discharge: 2017-11-14 | Disposition: A | Discharge status: Home / Self Care | Payer: 59 | Source: Ambulatory Visit | Attending: Family Medicine | Admitting: Family Medicine

## 2017-11-14 DIAGNOSIS — Z1231 Encounter for screening mammogram for malignant neoplasm of breast: Secondary | ICD-10-CM | POA: Diagnosis not present

## 2017-11-21 DIAGNOSIS — Z01419 Encounter for gynecological examination (general) (routine) without abnormal findings: Secondary | ICD-10-CM | POA: Diagnosis not present

## 2017-11-21 DIAGNOSIS — Z975 Presence of (intrauterine) contraceptive device: Secondary | ICD-10-CM | POA: Diagnosis not present

## 2017-11-21 DIAGNOSIS — F329 Major depressive disorder, single episode, unspecified: Secondary | ICD-10-CM | POA: Diagnosis not present

## 2017-11-21 DIAGNOSIS — E669 Obesity, unspecified: Secondary | ICD-10-CM | POA: Diagnosis not present

## 2017-11-21 DIAGNOSIS — R5383 Other fatigue: Secondary | ICD-10-CM | POA: Diagnosis not present

## 2017-11-21 DIAGNOSIS — Z13220 Encounter for screening for lipoid disorders: Secondary | ICD-10-CM | POA: Diagnosis not present

## 2017-11-21 DIAGNOSIS — Z6832 Body mass index (BMI) 32.0-32.9, adult: Secondary | ICD-10-CM | POA: Diagnosis not present

## 2017-11-21 MED FILL — FLUoxetine HCL 40 MG CAPS: 40 | 90 days supply | Qty: 90 | Fill #0

## 2017-12-10 ENCOUNTER — Encounter: Payer: Self-pay | Admitting: Orthopedic Surgery

## 2017-12-10 ENCOUNTER — Ambulatory Visit: Payer: 59 | Admitting: Orthopedic Surgery

## 2017-12-10 ENCOUNTER — Ambulatory Visit (INDEPENDENT_AMBULATORY_CARE_PROVIDER_SITE_OTHER): Payer: 59

## 2017-12-10 VITALS — BP 130/81 | HR 84 | Ht 63.5 in | Wt 192.0 lb

## 2017-12-10 DIAGNOSIS — M7700 Medial epicondylitis, unspecified elbow: Secondary | ICD-10-CM | POA: Diagnosis not present

## 2017-12-10 DIAGNOSIS — M25522 Pain in left elbow: Secondary | ICD-10-CM

## 2017-12-10 DIAGNOSIS — M7702 Medial epicondylitis, left elbow: Secondary | ICD-10-CM

## 2017-12-10 NOTE — Progress Notes (Signed)
  NEW PATIENT OFFICE VISIT    Chief Complaint  Patient presents with  . Elbow Pain    left    42 year old female presents for evaluation of left elbow pain  The patient reports medial left elbow pain dull aching worse with power grip flexion wrist activities present for several weeks worse with lifting objects no associated swelling or numbness of the fingers.  Denies trauma    Review of Systems  Constitutional: Negative for chills and fever.  Skin: Negative.  Negative for rash.  Neurological: Negative for tingling and focal weakness.     History reviewed. No pertinent past medical history. She denied diabetes hypertension heart disease  She says her family history consists of diabetes father grandmother grandfather colon cancer grandfather  No prior surgery History reviewed. No pertinent surgical history.  History reviewed. No pertinent family history. Social History   Tobacco Use  . Smoking status: Never Smoker  . Smokeless tobacco: Never Used  Substance Use Topics  . Alcohol use: No    Frequency: Never  . Drug use: No    Current Meds  Medication Sig  . FLUoxetine (PROZAC) 40 MG capsule TAKE 1 CAPSULE  40 MG TOTAL  BY MOUTH DAILY  . levonorgestrel (MIRENA) 20 MCG/24HR IUD by Intrauterine route.    BP 130/81   Pulse 84   Ht 5' 3.5" (1.613 m)   Wt 192 lb (87.1 kg)   BMI 33.48 kg/m   Physical Exam  Constitutional: She is oriented to person, place, and time. She appears well-developed and well-nourished.  Musculoskeletal:       Arms: Neurological: She is alert and oriented to person, place, and time.  Psychiatric: She has a normal mood and affect. Judgment normal.  Vitals reviewed.   Ortho Exam Provocative test for tennis elbow positive wrist flexion test versus resistance   Encounter Diagnoses  Name Primary?  . Left elbow pain Yes  . Medial epicondylitis of left elbow    X-ray shows no abnormality  PLAN:   Basic tennis elbow plan  Medial  epicondyle injection Advil 2 tablets twice a day for 6 weeks Tennis elbow brace for 6 weeks Tennis elbow exercises Cryotherapy  Medial epicondyle injection  Verbal consent was given for tennis elbow injection left elbow timeout confirmed the site skin was prepared with ethyl chloride and alcohol 40 mg of Depo-Medrol with 2 cc 1% lidocaine no complications were noted patient observed stable discharge

## 2017-12-10 NOTE — Patient Instructions (Addendum)
Golfer's Elbow Golfer's elbow, also called medial epicondylitis, is a condition that results from inflammation of the strong bands of tissue (tendons) that attach your forearm muscles to the inside of your bone at the elbow. These tendons affect the muscles that bend the palm toward the wrist (flexion). This condition is called golfer's elbow because it is more common among people who constantly bend and twist their wrists, such as golfers. This injury usually results from overuse. Tendons also become less flexible with age. This condition causes elbow pain that may spread to your forearm and upper arm. The pain may get worse when you bend your wrist downward. What are the causes? This condition is an overuse injury that is caused by:  Repeatedly flexing, turning, or twisting your wrist.  Constantly gripping objects with your hands.  What increases the risk? This condition is more likely to develop in people who play golf or tennis or have jobs that require the constant use of their hands. This injury is more common among:  Carpenters.  Gardeners.  Musicians.  Bricklayers.  Typists.  What are the signs or symptoms? Symptoms of this condition include:  Pain near the inner elbow or forearm.  Reduced grip strength.  How is this diagnosed? This condition is diagnosed based on your symptoms, medical history, and physical exam. During the exam, your health care provider may test your grip strength and move your wrist to check for pain. You may also have an MRI to confirm the diagnosis, look for other issues, and check for tears in the ligaments, muscles, or tendons. How is this treated? Treatment for this condition includes:  Stopping all activities that make you bend or twist your wrist until your pain and other symptoms go away.  Icing your wrist to relieve pain.  Taking NSAIDs or getting corticosteroid injections to reduce pain and swelling.  Doing stretches, range-of-motion,  and strengthening exercises (physical therapy) as told by your health care provider.  In rare cases, surgery may be needed if your condition does not improve. Follow these instructions at home:  If directed, apply ice to the injured area. ? Put ice in a plastic bag. ? Place a towel between your skin and the bag. ? Leave the ice on for 20 minutes, 2-3 times a day.  Move your fingers often to avoid stiffness.  Raise (elevate) the injured area above the level of your heart while you are sitting or lying down.  Return to your normal activities as told by your health care provider. Ask your health care provider what activities are safe for you.  Do exercises as told by your health care provider.  Do not use tobacco products, including cigarettes, chewing tobacco, or e-cigarettes. If you need help quitting, ask your health care provider.  Take over-the-counter and prescription medicines only as told by your health care provider.  Keep all follow-up visits as told by your health care provider. This is important. How is this prevented?  Warm up and stretch before being active.  Cool down and stretch after being active.  Give your body time to rest between periods of activity.  Make sure to use equipment that fits you.  Be safe and responsible while being active to avoid falls.  Do at least 150 minutes of moderate-intensity exercise each week, such as brisk walking or water aerobics.  Maintain physical fitness, including: ? Strength. ? Flexibility. ? Cardiovascular fitness. ? Endurance.  Perform exercises to strengthen the forearm muscles.  Slow your golf   swing to reduce shock in the arm when making contact with the ball, if you play golf. Contact a health care provider if:  Your pain does not improve or it gets worse.  You notice numbness in your hand. Get help right away if:  Your pain is severe.  You cannot move your wrist. This information is not intended to replace  advice given to you by your health care provider. Make sure you discuss any questions you have with your health care provider. Document Released: 10/22/2005 Document Revised: 06/26/2016 Document Reviewed: 07/04/2015 Elsevier Interactive Patient Education  [employer].

## 2018-02-21 MED FILL — FLUoxetine HCL 40 MG CAPS: 40 | 90 days supply | Qty: 90 | Fill #1

## 2018-05-29 MED FILL — FLUoxetine HCL 40 MG CAPS: 40 | 90 days supply | Qty: 90 | Fill #2

## 2018-06-17 DIAGNOSIS — H04123 Dry eye syndrome of bilateral lacrimal glands: Secondary | ICD-10-CM | POA: Diagnosis not present

## 2018-06-17 DIAGNOSIS — H16143 Punctate keratitis, bilateral: Secondary | ICD-10-CM | POA: Diagnosis not present

## 2018-06-28 DIAGNOSIS — Z6831 Body mass index (BMI) 31.0-31.9, adult: Secondary | ICD-10-CM | POA: Diagnosis not present

## 2018-06-28 DIAGNOSIS — J069 Acute upper respiratory infection, unspecified: Secondary | ICD-10-CM | POA: Diagnosis not present

## 2018-06-28 DIAGNOSIS — J029 Acute pharyngitis, unspecified: Secondary | ICD-10-CM | POA: Diagnosis not present

## 2018-08-14 MED FILL — FLUoxetine HCL 40 MG CAPS: 40 | 90 days supply | Qty: 90 | Fill #3

## 2018-11-25 MED FILL — FLUoxetine HCL 40 MG CAPS: 40 | 90 days supply | Qty: 90 | Fill #0

## 2018-12-16 DIAGNOSIS — Z131 Encounter for screening for diabetes mellitus: Secondary | ICD-10-CM | POA: Diagnosis not present

## 2018-12-16 DIAGNOSIS — Z01419 Encounter for gynecological examination (general) (routine) without abnormal findings: Secondary | ICD-10-CM | POA: Diagnosis not present

## 2018-12-16 DIAGNOSIS — Z13220 Encounter for screening for lipoid disorders: Secondary | ICD-10-CM | POA: Diagnosis not present

## 2018-12-16 DIAGNOSIS — R5383 Other fatigue: Secondary | ICD-10-CM | POA: Diagnosis not present

## 2018-12-16 DIAGNOSIS — Z975 Presence of (intrauterine) contraceptive device: Secondary | ICD-10-CM | POA: Diagnosis not present

## 2019-01-27 DIAGNOSIS — M792 Neuralgia and neuritis, unspecified: Secondary | ICD-10-CM | POA: Diagnosis not present

## 2019-01-27 DIAGNOSIS — M79671 Pain in right foot: Secondary | ICD-10-CM | POA: Diagnosis not present

## 2019-01-27 DIAGNOSIS — M722 Plantar fascial fibromatosis: Secondary | ICD-10-CM | POA: Diagnosis not present

## 2019-04-10 DIAGNOSIS — L570 Actinic keratosis: Secondary | ICD-10-CM | POA: Diagnosis not present

## 2019-04-10 DIAGNOSIS — C44319 Basal cell carcinoma of skin of other parts of face: Secondary | ICD-10-CM | POA: Diagnosis not present

## 2019-04-10 DIAGNOSIS — C4401 Basal cell carcinoma of skin of lip: Secondary | ICD-10-CM | POA: Diagnosis not present

## 2019-04-15 MED FILL — FLUoxetine HCL 40 MG CAPS: 40 | 90 days supply | Qty: 90 | Fill #0

## 2019-06-18 DIAGNOSIS — C4401 Basal cell carcinoma of skin of lip: Secondary | ICD-10-CM | POA: Diagnosis not present

## 2019-06-18 DIAGNOSIS — C44311 Basal cell carcinoma of skin of nose: Secondary | ICD-10-CM | POA: Diagnosis not present

## 2019-07-14 MED FILL — FLUoxetine HCL 40 MG CAPS: 40 | 90 days supply | Qty: 90 | Fill #1

## 2019-08-12 DIAGNOSIS — C44319 Basal cell carcinoma of skin of other parts of face: Secondary | ICD-10-CM | POA: Diagnosis not present

## 2019-08-26 MED FILL — FLUoxetine HCL 40 MG CAPS: 40 | 90 days supply | Qty: 90 | Fill #1

## 2019-09-09 DIAGNOSIS — C44319 Basal cell carcinoma of skin of other parts of face: Secondary | ICD-10-CM | POA: Diagnosis not present

## 2019-11-18 MED FILL — FLUoxetine HCL 40 MG CAPS: 40 | 90 days supply | Qty: 90 | Fill #2

## 2020-03-07 MED FILL — FLUoxetine HCL 40 MG CAPS: 40 | 90 days supply | Qty: 90 | Fill #0

## 2020-04-29 DIAGNOSIS — H52223 Regular astigmatism, bilateral: Secondary | ICD-10-CM | POA: Diagnosis not present

## 2020-04-29 DIAGNOSIS — H439 Unspecified disorder of vitreous body: Secondary | ICD-10-CM | POA: Diagnosis not present

## 2020-04-29 DIAGNOSIS — H524 Presbyopia: Secondary | ICD-10-CM | POA: Diagnosis not present

## 2020-05-12 ENCOUNTER — Other Ambulatory Visit (HOSPITAL_COMMUNITY): Payer: Self-pay | Admitting: Family Medicine

## 2020-05-12 DIAGNOSIS — Z1231 Encounter for screening mammogram for malignant neoplasm of breast: Secondary | ICD-10-CM

## 2020-05-13 ENCOUNTER — Ambulatory Visit (HOSPITAL_COMMUNITY): Admission: RE | Admit: 2020-05-13 | Payer: 59 | Source: Ambulatory Visit

## 2020-05-18 ENCOUNTER — Other Ambulatory Visit: Payer: Self-pay

## 2020-05-18 ENCOUNTER — Ambulatory Visit (HOSPITAL_COMMUNITY)
Admission: RE | Admit: 2020-05-18 | Discharge: 2020-05-18 | Disposition: A | Discharge status: Home / Self Care | Payer: 59 | Source: Ambulatory Visit | Attending: Family Medicine | Admitting: Family Medicine

## 2020-05-18 DIAGNOSIS — Z1231 Encounter for screening mammogram for malignant neoplasm of breast: Secondary | ICD-10-CM | POA: Insufficient documentation

## 2020-05-19 DIAGNOSIS — Z131 Encounter for screening for diabetes mellitus: Secondary | ICD-10-CM | POA: Diagnosis not present

## 2020-05-19 DIAGNOSIS — Z13220 Encounter for screening for lipoid disorders: Secondary | ICD-10-CM | POA: Diagnosis not present

## 2020-05-19 DIAGNOSIS — Z01419 Encounter for gynecological examination (general) (routine) without abnormal findings: Secondary | ICD-10-CM | POA: Diagnosis not present

## 2020-05-19 DIAGNOSIS — Z975 Presence of (intrauterine) contraceptive device: Secondary | ICD-10-CM | POA: Diagnosis not present

## 2020-06-02 DIAGNOSIS — H4389 Other disorders of vitreous body: Secondary | ICD-10-CM | POA: Diagnosis not present

## 2020-06-30 MED FILL — FLUoxetine HCL 40 MG CAPS: 40 | 90 days supply | Qty: 90 | Fill #1

## 2021-04-04 ENCOUNTER — Ambulatory Visit: Payer: 59 | Admitting: Physician Assistant

## 2021-04-18 DIAGNOSIS — M79671 Pain in right foot: Secondary | ICD-10-CM | POA: Diagnosis not present

## 2021-04-18 DIAGNOSIS — M79672 Pain in left foot: Secondary | ICD-10-CM | POA: Diagnosis not present

## 2021-04-18 DIAGNOSIS — M778 Other enthesopathies, not elsewhere classified: Secondary | ICD-10-CM | POA: Diagnosis not present

## 2021-05-13 DIAGNOSIS — Z6832 Body mass index (BMI) 32.0-32.9, adult: Secondary | ICD-10-CM | POA: Diagnosis not present

## 2021-05-13 DIAGNOSIS — R0789 Other chest pain: Secondary | ICD-10-CM | POA: Diagnosis not present

## 2021-06-21 ENCOUNTER — Other Ambulatory Visit (HOSPITAL_COMMUNITY): Payer: Self-pay

## 2021-06-23 DIAGNOSIS — Z6833 Body mass index (BMI) 33.0-33.9, adult: Secondary | ICD-10-CM | POA: Diagnosis not present

## 2021-06-23 DIAGNOSIS — F411 Generalized anxiety disorder: Secondary | ICD-10-CM | POA: Diagnosis not present

## 2021-06-24 ENCOUNTER — Other Ambulatory Visit (HOSPITAL_COMMUNITY): Payer: Self-pay

## 2021-06-24 MED ORDER — CITALOPRAM HYDROBROMIDE 40 MG PO TABS
40.0000 mg | ORAL_TABLET | Freq: Every day | ORAL | 5 refills | Status: DC
  Filled 2021-06-24: qty 30, 30d supply, fill #0

## 2021-06-26 ENCOUNTER — Other Ambulatory Visit (HOSPITAL_COMMUNITY): Payer: Self-pay

## 2021-08-07 DIAGNOSIS — R5383 Other fatigue: Secondary | ICD-10-CM | POA: Diagnosis not present

## 2021-08-07 DIAGNOSIS — Z01419 Encounter for gynecological examination (general) (routine) without abnormal findings: Secondary | ICD-10-CM | POA: Diagnosis not present

## 2021-08-07 DIAGNOSIS — Z975 Presence of (intrauterine) contraceptive device: Secondary | ICD-10-CM | POA: Diagnosis not present

## 2021-08-07 DIAGNOSIS — Z13220 Encounter for screening for lipoid disorders: Secondary | ICD-10-CM | POA: Diagnosis not present

## 2021-08-07 DIAGNOSIS — Z131 Encounter for screening for diabetes mellitus: Secondary | ICD-10-CM | POA: Diagnosis not present

## 2021-09-05 DIAGNOSIS — R946 Abnormal results of thyroid function studies: Secondary | ICD-10-CM | POA: Diagnosis not present

## 2021-09-08 ENCOUNTER — Other Ambulatory Visit (HOSPITAL_COMMUNITY): Payer: Self-pay

## 2021-09-08 MED ORDER — CITALOPRAM HYDROBROMIDE 40 MG PO TABS
40.0000 mg | ORAL_TABLET | Freq: Every day | ORAL | 1 refills | Status: DC
  Filled 2021-09-08: qty 90, 90d supply, fill #0
  Filled 2022-01-18: qty 90, 90d supply, fill #1

## 2021-09-25 IMAGING — MG DIGITAL SCREENING BILAT W/ TOMO W/ CAD
8 series · 8 of 24 positions shown · non-contrast
Comparison: Previous exam(s).

CLINICAL DATA: Screening.

EXAM:
DIGITAL SCREENING BILATERAL MAMMOGRAM WITH TOMO AND CAD

[L MLO synth-2D]
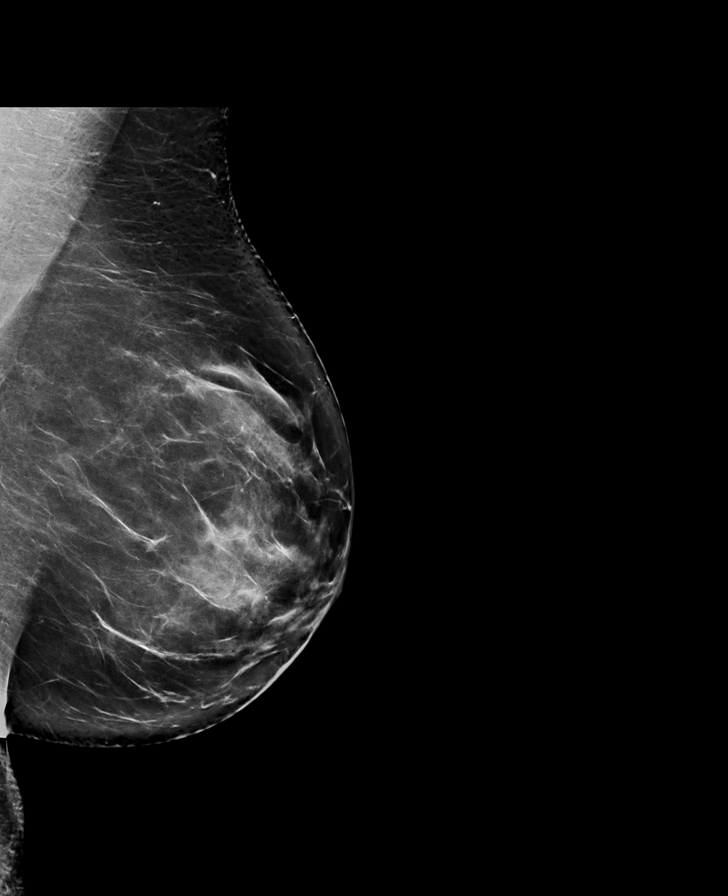

[R MLO synth-2D]
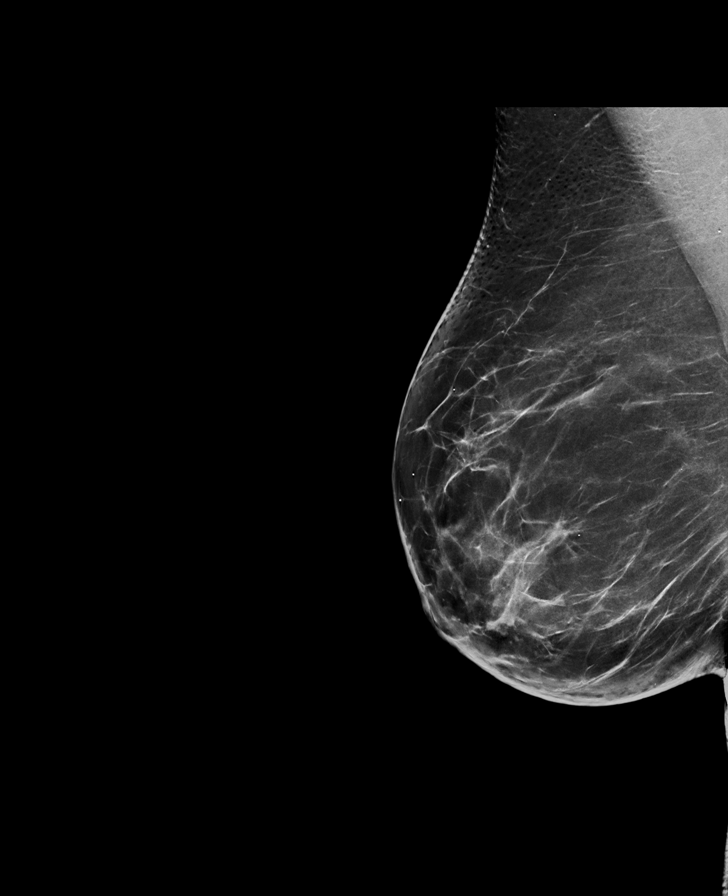

[L CC synth-2D]
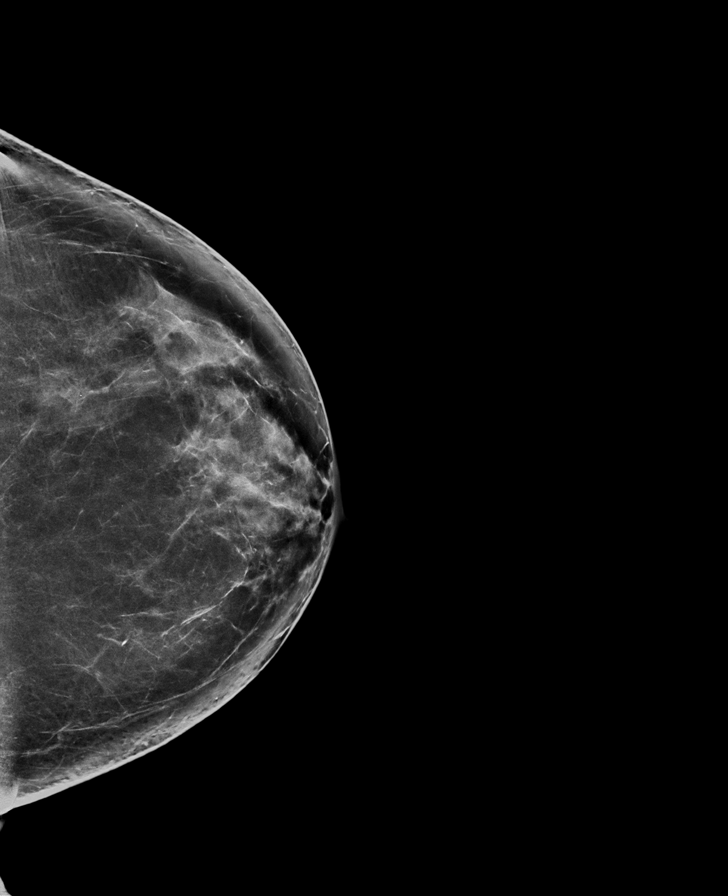

[R CC synth-2D]
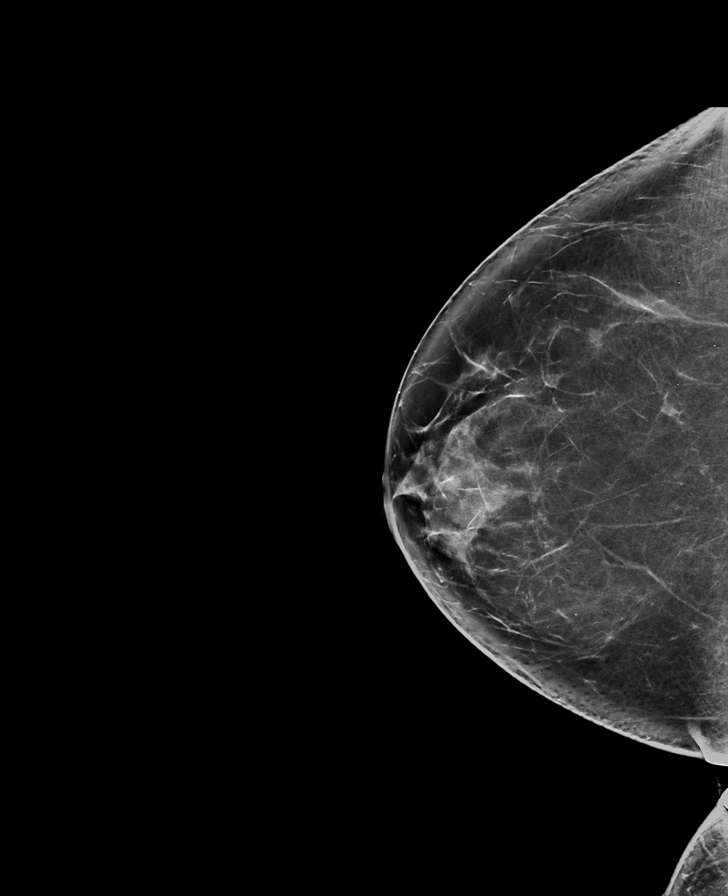

[R MLO tomo · tomo slice 45/88.0]
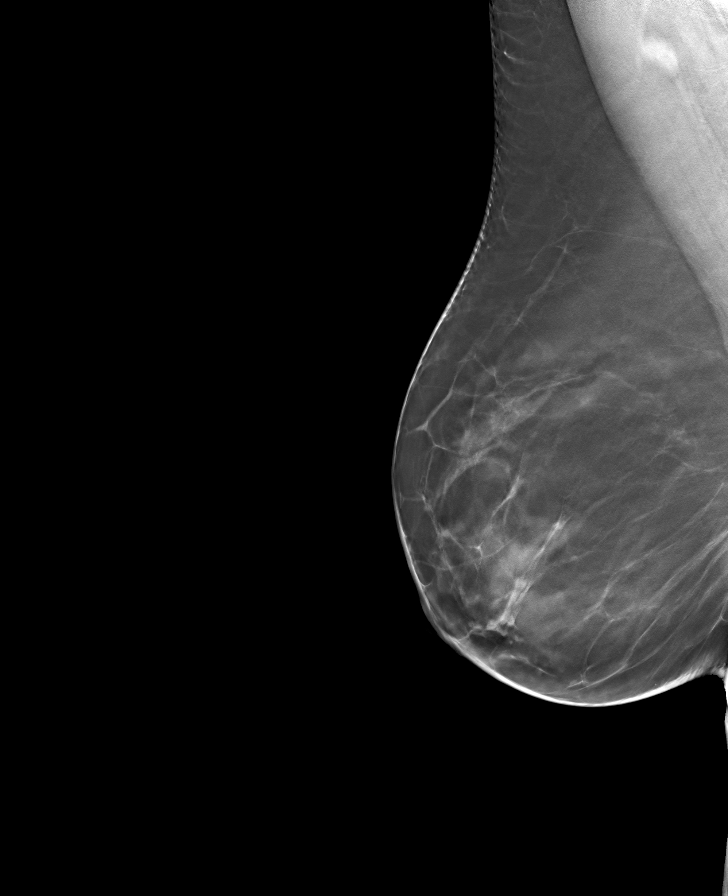

[L MLO tomo · tomo slice 47/92.0]
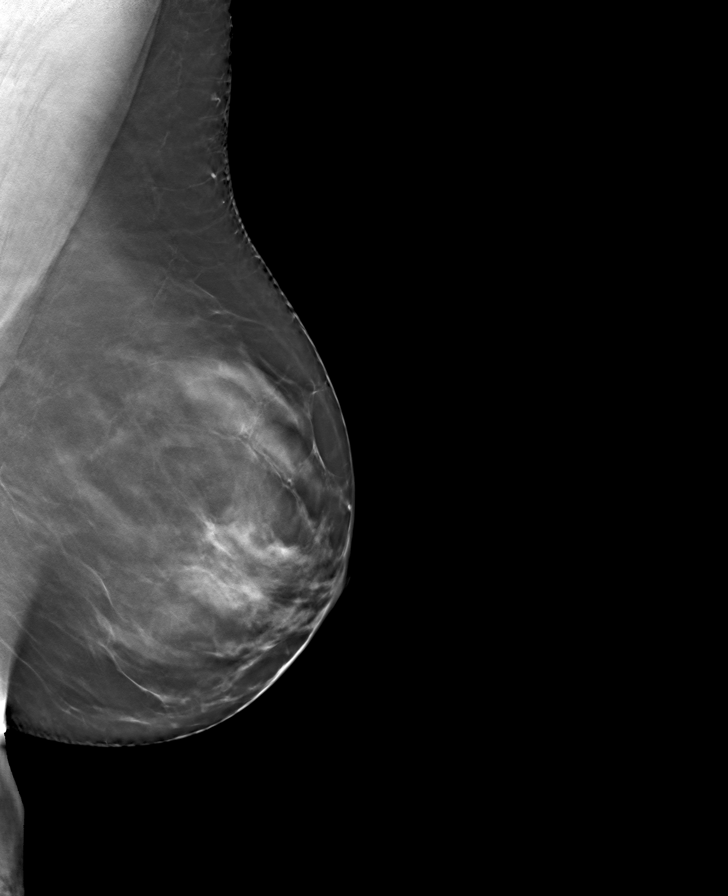

[R CC tomo · tomo slice 41/81.0]
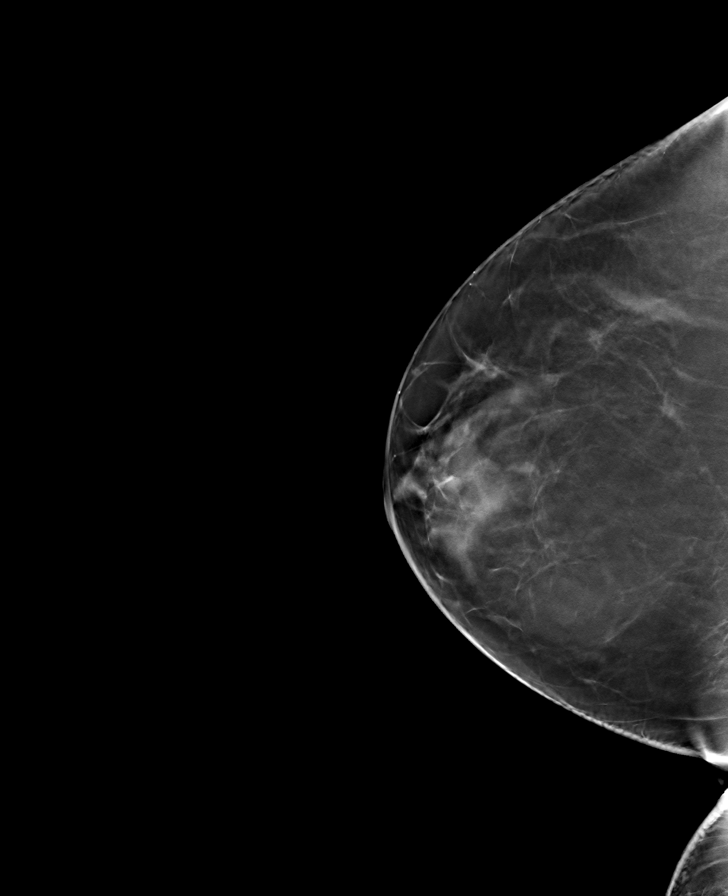

[L CC tomo · tomo slice 41/82.0]
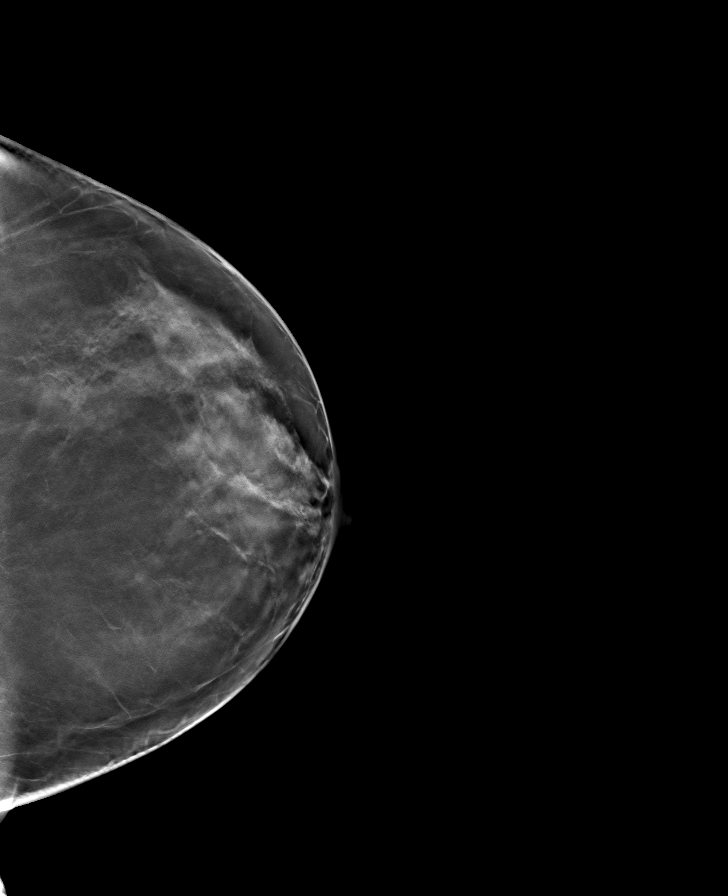

[8 of 24 positions shown; findings below may reference images not displayed]

ACR Breast Density Category b: There are scattered areas of
fibroglandular density.
FINDINGS: There are no findings suspicious for malignancy. Images were
processed with CAD.
IMPRESSION: No mammographic evidence of malignancy. A result letter of this
screening mammogram will be mailed directly to the patient.

RECOMMENDATION:
Screening mammogram in one year. (Code:[id number])

BI-RADS CATEGORY  1: Negative.

## 2021-10-11 ENCOUNTER — Encounter: Payer: Self-pay | Admitting: Physician Assistant

## 2021-10-11 ENCOUNTER — Other Ambulatory Visit: Payer: Self-pay

## 2021-10-11 ENCOUNTER — Ambulatory Visit: Payer: 59 | Admitting: Physician Assistant

## 2021-10-11 DIAGNOSIS — L570 Actinic keratosis: Secondary | ICD-10-CM

## 2021-10-11 DIAGNOSIS — D2122 Benign neoplasm of connective and other soft tissue of left lower limb, including hip: Secondary | ICD-10-CM | POA: Diagnosis not present

## 2021-10-11 DIAGNOSIS — Z1283 Encounter for screening for malignant neoplasm of skin: Secondary | ICD-10-CM

## 2021-10-11 DIAGNOSIS — D2372 Other benign neoplasm of skin of left lower limb, including hip: Secondary | ICD-10-CM

## 2021-10-11 DIAGNOSIS — Z86018 Personal history of other benign neoplasm: Secondary | ICD-10-CM

## 2021-10-11 DIAGNOSIS — Z85828 Personal history of other malignant neoplasm of skin: Secondary | ICD-10-CM | POA: Diagnosis not present

## 2021-10-11 DIAGNOSIS — C44311 Basal cell carcinoma of skin of nose: Secondary | ICD-10-CM

## 2021-10-11 DIAGNOSIS — C44319 Basal cell carcinoma of skin of other parts of face: Secondary | ICD-10-CM | POA: Diagnosis not present

## 2021-10-11 DIAGNOSIS — D485 Neoplasm of uncertain behavior of skin: Secondary | ICD-10-CM

## 2021-10-11 MED ORDER — FLUOROURACIL 5 % EX CREA
TOPICAL_CREAM | CUTANEOUS | 0 refills | Status: DC
Start: 2021-10-11 — End: 2023-01-08

## 2021-10-11 NOTE — Progress Notes (Signed)
Follow-Up Visit   Subjective  Latasha Allen is a 45 y.o. female who presents for the following: Annual Exam (Full body skin scan, scaly lesion right side of nose x months, under both eyes, skin tag on left  inner thigh previously biopsy.  Patient  has history of atypical moles and bcc.  ).   The following portions of the chart were reviewed this encounter and updated as appropriate:  Tobacco  Allergies  Meds  Problems  Med Hx  Surg Hx  Fam Hx      Objective  Well appearing patient in no apparent distress; mood and affect are within normal limits.  A full examination was performed including scalp, head, eyes, ears, nose, lips, neck, chest, axillae, abdomen, back, buttocks, bilateral upper extremities, bilateral lower extremities, hands, feet, fingers, toes, fingernails, and toenails. All findings within normal limits unless otherwise noted below.  Head No atypical nevi. Previous bcc and atypia   Right Nasal Sidewall     Right Malar Cheek     Left Medial Thigh     bridge of nose     Assessment & Plan  Screening exam for skin cancer Head  Increase frequency of skin exams according to her biopsy results.   Neoplasm of uncertain behavior of skin (4) Right Nasal Sidewall  Skin / nail biopsy Type of biopsy: tangential   Informed consent: discussed and consent obtained   Timeout: patient name, date of birth, surgical site, and procedure verified   Anesthesia: the lesion was anesthetized in a standard fashion   Anesthetic:  1% lidocaine w/ epinephrine 1-100,000 local infiltration Instrument used: flexible razor blade   Hemostasis achieved with: aluminum chloride and electrodesiccation   Outcome: patient tolerated procedure well   Post-procedure details: wound care instructions given    Specimen 1 - Surgical pathology Differential Diagnosis: bcc scc cautery only  Check Margins: yes  Right Malar Cheek  Skin / nail biopsy Type of biopsy: tangential    Informed consent: discussed and consent obtained   Timeout: patient name, date of birth, surgical site, and procedure verified   Anesthesia: the lesion was anesthetized in a standard fashion   Anesthetic:  1% lidocaine w/ epinephrine 1-100,000 local infiltration Instrument used: flexible razor blade   Hemostasis achieved with: aluminum chloride and electrodesiccation   Outcome: patient tolerated procedure well   Post-procedure details: wound care instructions given    Specimen 2 - Surgical pathology Differential Diagnosis: bcc scc cautery only  Check Margins: yes  Left Medial Thigh  Skin / nail biopsy Type of biopsy: tangential   Informed consent: discussed and consent obtained   Timeout: patient name, date of birth, surgical site, and procedure verified   Anesthesia: the lesion was anesthetized in a standard fashion   Anesthetic:  1% lidocaine w/ epinephrine 1-100,000 local infiltration Instrument used: flexible razor blade   Hemostasis achieved with: aluminum chloride and electrodesiccation   Outcome: patient tolerated procedure well   Post-procedure details: wound care instructions given    Specimen 3 - Surgical pathology Differential Diagnosis: DF cautery only  Check Margins: No  bridge of nose  Skin / nail biopsy Type of biopsy: tangential   Informed consent: discussed and consent obtained   Timeout: patient name, date of birth, surgical site, and procedure verified   Procedure prep:  Patient was prepped and draped in usual sterile fashion (Non sterile) Prep type:  Chlorhexidine Anesthesia: the lesion was anesthetized in a standard fashion   Anesthetic:  1% lidocaine w/  epinephrine 1-100,000 local infiltration Instrument used: flexible razor blade   Outcome: patient tolerated procedure well   Post-procedure details: wound care instructions given    Specimen 4 - Surgical pathology Differential Diagnosis: bcc scc cautery  only  Check Margins: yes   AK (actinic  keratosis) Nose  fluorouracil (EFUDEX) 5 % cream - Nose Apply nightly x 2 weeks to face   I, Pate Aylward, PA-C, have reviewed all documentation's for this visit.  The documentation on 10/11/21 for the exam, diagnosis, procedures and orders are all accurate and complete.

## 2021-10-11 NOTE — Patient Instructions (Signed)

## 2021-10-18 ENCOUNTER — Telehealth: Payer: Self-pay | Admitting: *Deleted

## 2021-10-18 ENCOUNTER — Telehealth: Payer: Self-pay | Admitting: Physician Assistant

## 2021-10-18 NOTE — Telephone Encounter (Signed)
-----   Message from Warren Danes, [place] sent at 10/17/2021  5:40 PM EST ----- 1,2,4 mohs

## 2021-10-18 NOTE — Telephone Encounter (Signed)
Pathology to patient- referral sent to skin surgery center for MOHS 

## 2021-10-18 NOTE — Telephone Encounter (Signed)
Patient is calling for pathology results from last visit with Kelli Sheffield, PA-C. 

## 2021-11-10 DIAGNOSIS — R059 Cough, unspecified: Secondary | ICD-10-CM | POA: Diagnosis not present

## 2021-11-10 DIAGNOSIS — Z20828 Contact with and (suspected) exposure to other viral communicable diseases: Secondary | ICD-10-CM | POA: Diagnosis not present

## 2021-11-10 DIAGNOSIS — R509 Fever, unspecified: Secondary | ICD-10-CM | POA: Diagnosis not present

## 2021-11-10 DIAGNOSIS — Z6833 Body mass index (BMI) 33.0-33.9, adult: Secondary | ICD-10-CM | POA: Diagnosis not present

## 2021-11-28 DIAGNOSIS — C44311 Basal cell carcinoma of skin of nose: Secondary | ICD-10-CM | POA: Diagnosis not present

## 2021-12-13 DIAGNOSIS — C44319 Basal cell carcinoma of skin of other parts of face: Secondary | ICD-10-CM | POA: Diagnosis not present

## 2021-12-26 DIAGNOSIS — H31093 Other chorioretinal scars, bilateral: Secondary | ICD-10-CM | POA: Diagnosis not present

## 2021-12-26 DIAGNOSIS — H524 Presbyopia: Secondary | ICD-10-CM | POA: Diagnosis not present

## 2021-12-26 DIAGNOSIS — H04123 Dry eye syndrome of bilateral lacrimal glands: Secondary | ICD-10-CM | POA: Diagnosis not present

## 2021-12-26 DIAGNOSIS — H16223 Keratoconjunctivitis sicca, not specified as Sjogren's, bilateral: Secondary | ICD-10-CM | POA: Diagnosis not present

## 2022-01-02 DIAGNOSIS — H43823 Vitreomacular adhesion, bilateral: Secondary | ICD-10-CM | POA: Diagnosis not present

## 2022-01-02 DIAGNOSIS — H2513 Age-related nuclear cataract, bilateral: Secondary | ICD-10-CM | POA: Diagnosis not present

## 2022-01-02 DIAGNOSIS — H35413 Lattice degeneration of retina, bilateral: Secondary | ICD-10-CM | POA: Diagnosis not present

## 2022-01-18 ENCOUNTER — Other Ambulatory Visit (HOSPITAL_COMMUNITY): Payer: Self-pay

## 2022-01-19 DIAGNOSIS — H04123 Dry eye syndrome of bilateral lacrimal glands: Secondary | ICD-10-CM | POA: Diagnosis not present

## 2022-02-28 DIAGNOSIS — C44311 Basal cell carcinoma of skin of nose: Secondary | ICD-10-CM | POA: Diagnosis not present

## 2022-03-30 DIAGNOSIS — Z6830 Body mass index (BMI) 30.0-30.9, adult: Secondary | ICD-10-CM | POA: Diagnosis not present

## 2022-03-30 DIAGNOSIS — I10 Essential (primary) hypertension: Secondary | ICD-10-CM | POA: Diagnosis not present

## 2022-03-30 DIAGNOSIS — L0591 Pilonidal cyst without abscess: Secondary | ICD-10-CM | POA: Diagnosis not present

## 2022-05-07 DIAGNOSIS — M545 Low back pain, unspecified: Secondary | ICD-10-CM | POA: Diagnosis not present

## 2022-05-07 DIAGNOSIS — Z6830 Body mass index (BMI) 30.0-30.9, adult: Secondary | ICD-10-CM | POA: Diagnosis not present

## 2022-05-07 DIAGNOSIS — R030 Elevated blood-pressure reading, without diagnosis of hypertension: Secondary | ICD-10-CM | POA: Diagnosis not present

## 2022-05-22 ENCOUNTER — Other Ambulatory Visit (HOSPITAL_COMMUNITY): Payer: Self-pay

## 2022-05-22 MED ORDER — CITALOPRAM HYDROBROMIDE 40 MG PO TABS
60.0000 mg | ORAL_TABLET | Freq: Every day | ORAL | 0 refills | Status: DC
  Filled 2022-05-22: qty 135, 90d supply, fill #0

## 2022-05-24 DIAGNOSIS — L905 Scar conditions and fibrosis of skin: Secondary | ICD-10-CM | POA: Diagnosis not present

## 2022-08-02 DIAGNOSIS — M79671 Pain in right foot: Secondary | ICD-10-CM | POA: Diagnosis not present

## 2022-08-02 DIAGNOSIS — G5761 Lesion of plantar nerve, right lower limb: Secondary | ICD-10-CM | POA: Diagnosis not present

## 2022-08-21 ENCOUNTER — Other Ambulatory Visit (HOSPITAL_COMMUNITY): Payer: Self-pay

## 2022-08-21 MED ORDER — CITALOPRAM HYDROBROMIDE 40 MG PO TABS
60.0000 mg | ORAL_TABLET | Freq: Every day | ORAL | 0 refills | Status: DC
  Filled 2022-08-21: qty 135, 90d supply, fill #0

## 2022-08-22 ENCOUNTER — Other Ambulatory Visit (HOSPITAL_COMMUNITY): Payer: Self-pay

## 2022-10-05 DIAGNOSIS — R030 Elevated blood-pressure reading, without diagnosis of hypertension: Secondary | ICD-10-CM | POA: Diagnosis not present

## 2022-10-05 DIAGNOSIS — M545 Low back pain, unspecified: Secondary | ICD-10-CM | POA: Diagnosis not present

## 2022-10-05 DIAGNOSIS — M533 Sacrococcygeal disorders, not elsewhere classified: Secondary | ICD-10-CM | POA: Diagnosis not present

## 2022-10-05 DIAGNOSIS — Z6830 Body mass index (BMI) 30.0-30.9, adult: Secondary | ICD-10-CM | POA: Diagnosis not present

## 2022-10-10 ENCOUNTER — Other Ambulatory Visit (HOSPITAL_COMMUNITY): Payer: Self-pay | Admitting: Family Medicine

## 2022-10-10 DIAGNOSIS — M533 Sacrococcygeal disorders, not elsewhere classified: Secondary | ICD-10-CM

## 2022-10-10 DIAGNOSIS — M545 Low back pain, unspecified: Secondary | ICD-10-CM

## 2022-10-11 ENCOUNTER — Ambulatory Visit: Payer: 59 | Admitting: Physician Assistant

## 2022-10-30 ENCOUNTER — Ambulatory Visit (HOSPITAL_COMMUNITY): Payer: 59

## 2022-10-30 DIAGNOSIS — J101 Influenza due to other identified influenza virus with other respiratory manifestations: Secondary | ICD-10-CM | POA: Diagnosis not present

## 2022-10-30 DIAGNOSIS — Z6832 Body mass index (BMI) 32.0-32.9, adult: Secondary | ICD-10-CM | POA: Diagnosis not present

## 2022-10-30 DIAGNOSIS — Z20828 Contact with and (suspected) exposure to other viral communicable diseases: Secondary | ICD-10-CM | POA: Diagnosis not present

## 2022-10-30 DIAGNOSIS — R030 Elevated blood-pressure reading, without diagnosis of hypertension: Secondary | ICD-10-CM | POA: Diagnosis not present

## 2022-11-16 DIAGNOSIS — L821 Other seborrheic keratosis: Secondary | ICD-10-CM | POA: Diagnosis not present

## 2022-11-16 DIAGNOSIS — Z85828 Personal history of other malignant neoplasm of skin: Secondary | ICD-10-CM | POA: Diagnosis not present

## 2022-11-16 DIAGNOSIS — L814 Other melanin hyperpigmentation: Secondary | ICD-10-CM | POA: Diagnosis not present

## 2022-11-16 DIAGNOSIS — L578 Other skin changes due to chronic exposure to nonionizing radiation: Secondary | ICD-10-CM | POA: Diagnosis not present

## 2022-11-16 DIAGNOSIS — L570 Actinic keratosis: Secondary | ICD-10-CM | POA: Diagnosis not present

## 2022-11-16 DIAGNOSIS — L538 Other specified erythematous conditions: Secondary | ICD-10-CM | POA: Diagnosis not present

## 2022-11-16 DIAGNOSIS — D2372 Other benign neoplasm of skin of left lower limb, including hip: Secondary | ICD-10-CM | POA: Diagnosis not present

## 2022-11-16 DIAGNOSIS — Z08 Encounter for follow-up examination after completed treatment for malignant neoplasm: Secondary | ICD-10-CM | POA: Diagnosis not present

## 2022-11-16 DIAGNOSIS — L820 Inflamed seborrheic keratosis: Secondary | ICD-10-CM | POA: Diagnosis not present

## 2022-11-21 DIAGNOSIS — R030 Elevated blood-pressure reading, without diagnosis of hypertension: Secondary | ICD-10-CM | POA: Diagnosis not present

## 2022-11-21 DIAGNOSIS — R509 Fever, unspecified: Secondary | ICD-10-CM | POA: Diagnosis not present

## 2022-11-21 DIAGNOSIS — Z20828 Contact with and (suspected) exposure to other viral communicable diseases: Secondary | ICD-10-CM | POA: Diagnosis not present

## 2022-11-21 DIAGNOSIS — J189 Pneumonia, unspecified organism: Secondary | ICD-10-CM | POA: Diagnosis not present

## 2022-11-21 DIAGNOSIS — Z6832 Body mass index (BMI) 32.0-32.9, adult: Secondary | ICD-10-CM | POA: Diagnosis not present

## 2022-11-22 ENCOUNTER — Ambulatory Visit (HOSPITAL_COMMUNITY)
Admission: RE | Admit: 2022-11-22 | Discharge: 2022-11-22 | Disposition: A | Discharge status: Home / Self Care | Payer: 59 | Source: Ambulatory Visit | Attending: Family Medicine | Admitting: Family Medicine

## 2022-11-22 ENCOUNTER — Other Ambulatory Visit (HOSPITAL_COMMUNITY): Payer: Self-pay

## 2022-11-22 DIAGNOSIS — K5730 Diverticulosis of large intestine without perforation or abscess without bleeding: Secondary | ICD-10-CM | POA: Diagnosis not present

## 2022-11-22 DIAGNOSIS — M533 Sacrococcygeal disorders, not elsewhere classified: Secondary | ICD-10-CM

## 2022-11-22 DIAGNOSIS — M545 Low back pain, unspecified: Secondary | ICD-10-CM | POA: Insufficient documentation

## 2022-11-22 DIAGNOSIS — R188 Other ascites: Secondary | ICD-10-CM | POA: Diagnosis not present

## 2022-11-22 MED ORDER — CITALOPRAM HYDROBROMIDE 40 MG PO TABS
60.0000 mg | ORAL_TABLET | Freq: Every day | ORAL | 0 refills | Status: DC
  Filled 2022-11-22 – 2022-12-04 (×2): qty 135, 90d supply, fill #0

## 2022-12-04 ENCOUNTER — Other Ambulatory Visit (HOSPITAL_COMMUNITY): Payer: Self-pay

## 2022-12-04 ENCOUNTER — Other Ambulatory Visit (HOSPITAL_COMMUNITY): Payer: Self-pay | Admitting: Family Medicine

## 2022-12-04 DIAGNOSIS — Z1231 Encounter for screening mammogram for malignant neoplasm of breast: Secondary | ICD-10-CM

## 2022-12-10 ENCOUNTER — Ambulatory Visit (HOSPITAL_COMMUNITY)
Admission: RE | Admit: 2022-12-10 | Discharge: 2022-12-10 | Disposition: A | Discharge status: Home / Self Care | Payer: 59 | Source: Ambulatory Visit | Attending: Family Medicine | Admitting: Family Medicine

## 2022-12-10 DIAGNOSIS — Z1231 Encounter for screening mammogram for malignant neoplasm of breast: Secondary | ICD-10-CM | POA: Insufficient documentation

## 2022-12-20 DIAGNOSIS — S338XXA Sprain of other parts of lumbar spine and pelvis, initial encounter: Secondary | ICD-10-CM | POA: Diagnosis not present

## 2022-12-20 DIAGNOSIS — S134XXA Sprain of ligaments of cervical spine, initial encounter: Secondary | ICD-10-CM | POA: Diagnosis not present

## 2022-12-20 DIAGNOSIS — S233XXA Sprain of ligaments of thoracic spine, initial encounter: Secondary | ICD-10-CM | POA: Diagnosis not present

## 2022-12-24 DIAGNOSIS — S233XXA Sprain of ligaments of thoracic spine, initial encounter: Secondary | ICD-10-CM | POA: Diagnosis not present

## 2022-12-24 DIAGNOSIS — S134XXA Sprain of ligaments of cervical spine, initial encounter: Secondary | ICD-10-CM | POA: Diagnosis not present

## 2022-12-24 DIAGNOSIS — S338XXA Sprain of other parts of lumbar spine and pelvis, initial encounter: Secondary | ICD-10-CM | POA: Diagnosis not present

## 2022-12-31 DIAGNOSIS — S134XXA Sprain of ligaments of cervical spine, initial encounter: Secondary | ICD-10-CM | POA: Diagnosis not present

## 2022-12-31 DIAGNOSIS — S338XXA Sprain of other parts of lumbar spine and pelvis, initial encounter: Secondary | ICD-10-CM | POA: Diagnosis not present

## 2022-12-31 DIAGNOSIS — S233XXA Sprain of ligaments of thoracic spine, initial encounter: Secondary | ICD-10-CM | POA: Diagnosis not present

## 2023-01-04 ENCOUNTER — Other Ambulatory Visit (HOSPITAL_COMMUNITY): Payer: Self-pay

## 2023-01-07 DIAGNOSIS — S134XXA Sprain of ligaments of cervical spine, initial encounter: Secondary | ICD-10-CM | POA: Diagnosis not present

## 2023-01-07 DIAGNOSIS — S338XXA Sprain of other parts of lumbar spine and pelvis, initial encounter: Secondary | ICD-10-CM | POA: Diagnosis not present

## 2023-01-07 DIAGNOSIS — S233XXA Sprain of ligaments of thoracic spine, initial encounter: Secondary | ICD-10-CM | POA: Diagnosis not present

## 2023-01-08 ENCOUNTER — Other Ambulatory Visit (HOSPITAL_COMMUNITY)
Admission: RE | Admit: 2023-01-08 | Discharge: 2023-01-08 | Disposition: A | Discharge status: Home / Self Care | Payer: 59 | Source: Ambulatory Visit | Attending: Radiology | Admitting: Radiology

## 2023-01-08 ENCOUNTER — Encounter: Payer: Self-pay | Admitting: Radiology

## 2023-01-08 ENCOUNTER — Ambulatory Visit (INDEPENDENT_AMBULATORY_CARE_PROVIDER_SITE_OTHER): Payer: 59 | Admitting: Radiology

## 2023-01-08 VITALS — BP 116/78 | Ht 64.0 in | Wt 194.0 lb

## 2023-01-08 DIAGNOSIS — H35411 Lattice degeneration of retina, right eye: Secondary | ICD-10-CM | POA: Diagnosis not present

## 2023-01-08 DIAGNOSIS — Z01419 Encounter for gynecological examination (general) (routine) without abnormal findings: Secondary | ICD-10-CM

## 2023-01-08 DIAGNOSIS — Z30433 Encounter for removal and reinsertion of intrauterine contraceptive device: Secondary | ICD-10-CM

## 2023-01-08 DIAGNOSIS — H5201 Hypermetropia, right eye: Secondary | ICD-10-CM | POA: Diagnosis not present

## 2023-01-08 NOTE — Progress Notes (Signed)
   Latasha Allen 05-24-1976 [id number]   History:  47 y.o. G2P2 presents for annual exam. IUD inserted 04/2015 per patient, due for replacement. Struggling with lower back pain in coccyx, seeing chiropractor.   Gynecologic History No LMP recorded. (Menstrual status: IUD).   Contraception/Family planning: IUD Sexually active: yes Last Pap: 2021. Results were: normal Last mammogram: 12/10/22. Results were: normal  Obstetric History OB History  Gravida Para Term Preterm AB Living  '2 2       2  '$ SAB IAB Ectopic Multiple Live Births               # Outcome Date GA Lbr Len/2nd Weight Sex Delivery Anes PTL Lv  2 Para           1 Para              The following portions of the patient's history were reviewed and updated as appropriate: allergies, current medications, past family history, past medical history, past social history, past surgical history, and problem list.  Review of Systems Pertinent items noted in HPI and remainder of comprehensive ROS otherwise negative.   Past medical history, past surgical history, family history and social history were all reviewed and documented in the EPIC chart.   Exam:  Vitals:   01/08/23 0949  BP: 116/78  Weight: 194 lb (88 kg)  Height: '5\' 4"'$  (1.626 m)   Body mass index is 33.3 kg/m.  General appearance:  Normal Thyroid:  Symmetrical, normal in size, without palpable masses or nodularity. Respiratory  Auscultation:  Clear without wheezing or rhonchi Cardiovascular  Auscultation:  Regular rate, without rubs, murmurs or gallops  Edema/varicosities:  Not grossly evident Abdominal  Soft,nontender, without masses, guarding or rebound.  Liver/spleen:  No organomegaly noted  Hernia:  None appreciated  Skin  Inspection:  Grossly normal Breasts: Examined lying and sitting.   Right: Without masses, retractions, nipple discharge or axillary adenopathy.   Left: Without masses, retractions, nipple discharge or axillary  adenopathy. Genitourinary   Inguinal/mons:  Normal without inguinal adenopathy  External genitalia:  Normal appearing vulva with no masses, tenderness, or lesions  BUS/Urethra/Skene's glands:  Normal without masses or exudate  Vagina:  Normal appearing with normal color and discharge, no lesions. Poor tone.  Cervix:  Normal appearing without discharge or lesions  Uterus:  Normal in size, shape and contour.  Mobile, nontender  Adnexa/parametria:     Rt: Normal in size, without masses or tenderness.   Lt: Normal in size, without masses or tenderness.  Anus and perineum: Normal   Patient informed chaperone available to be present for breast and pelvic exam. Patient has requested no chaperone to be present. Patient has been advised what will be completed during breast and pelvic exam.   Assessment/Plan:   1. Well woman exam with routine gynecological exam - mammogram up to date - Cytology - PAP( [facility])  2. Encounter for removal and reinsertion of intrauterine contraceptive device (IUD) - IUD Insertion; Future     Discussed SBE, colonoscopy and DEXA screening as directed/appropriate. Recommend 142mns of exercise weekly, including weight bearing exercise. Encouraged the use of seatbelts and sunscreen. Return in 1 year for annual or as needed.   CRubbie BattiestB WHNP-BC 10:29 AM 01/08/2023

## 2023-01-15 LAB — CYTOLOGY - PAP
Adequacy: ABSENT
Comment: NEGATIVE
Diagnosis: NEGATIVE
High risk HPV: NEGATIVE

## 2023-02-26 DIAGNOSIS — G5761 Lesion of plantar nerve, right lower limb: Secondary | ICD-10-CM | POA: Diagnosis not present

## 2023-02-26 DIAGNOSIS — M79671 Pain in right foot: Secondary | ICD-10-CM | POA: Diagnosis not present

## 2023-03-19 ENCOUNTER — Other Ambulatory Visit: Payer: Self-pay

## 2023-03-19 ENCOUNTER — Other Ambulatory Visit (HOSPITAL_COMMUNITY): Payer: Self-pay

## 2023-03-19 MED ORDER — CITALOPRAM HYDROBROMIDE 40 MG PO TABS
60.0000 mg | ORAL_TABLET | Freq: Every day | ORAL | 2 refills | Status: DC
  Filled 2023-03-19: qty 135, 90d supply, fill #0
  Filled 2023-06-12 – 2023-06-19 (×2): qty 135, 90d supply, fill #1
  Filled 2023-09-30 – 2023-10-08 (×2): qty 135, 90d supply, fill #2

## 2023-03-26 DIAGNOSIS — G5761 Lesion of plantar nerve, right lower limb: Secondary | ICD-10-CM | POA: Diagnosis not present

## 2023-03-26 DIAGNOSIS — M79672 Pain in left foot: Secondary | ICD-10-CM | POA: Diagnosis not present

## 2023-03-26 DIAGNOSIS — M79671 Pain in right foot: Secondary | ICD-10-CM | POA: Diagnosis not present

## 2023-03-26 DIAGNOSIS — G5762 Lesion of plantar nerve, left lower limb: Secondary | ICD-10-CM | POA: Diagnosis not present

## 2023-04-23 DIAGNOSIS — M722 Plantar fascial fibromatosis: Secondary | ICD-10-CM | POA: Diagnosis not present

## 2023-04-23 DIAGNOSIS — G5761 Lesion of plantar nerve, right lower limb: Secondary | ICD-10-CM | POA: Diagnosis not present

## 2023-04-23 DIAGNOSIS — M79671 Pain in right foot: Secondary | ICD-10-CM | POA: Diagnosis not present

## 2023-04-24 ENCOUNTER — Ambulatory Visit (INDEPENDENT_AMBULATORY_CARE_PROVIDER_SITE_OTHER): Payer: 59 | Admitting: Radiology

## 2023-04-24 VITALS — BP 120/78

## 2023-04-24 DIAGNOSIS — Z30433 Encounter for removal and reinsertion of intrauterine contraceptive device: Secondary | ICD-10-CM | POA: Diagnosis not present

## 2023-04-24 DIAGNOSIS — Z01812 Encounter for preprocedural laboratory examination: Secondary | ICD-10-CM

## 2023-04-24 LAB — PREGNANCY, URINE: Preg Test, Ur: NEGATIVE

## 2023-04-24 MED ORDER — LEVONORGESTREL 20 MCG/DAY IU IUD
1.0000 | INTRAUTERINE_SYSTEM | Freq: Once | INTRAUTERINE | Status: AC
  Administered 2023-04-24: 1 via INTRAUTERINE

## 2023-04-24 NOTE — Addendum Note (Signed)
Addended by: Windle Guard on: 04/24/2023 04:37 PM   Modules accepted: Orders

## 2023-04-24 NOTE — Progress Notes (Signed)
   Latasha Allen 1976/03/04 [id number]   History:  47 y.o. G2P2 presents for replacement of Mirena IUD.  Pt has been counseled about risks and benefits as well as complications.  Consent is obtained today.  No LMP recorded. (Menstrual status: IUD). GC/CT/Trich testing: obtained  Past medical history, past surgical history, family history and social history were all reviewed and documented in the EPIC chart.  ROS:  A ROS was performed and pertinent positives and negatives are included.  Exam: Vitals:   04/24/23 1549  BP: 120/78   There is no height or weight on file to calculate BMI.  Pelvic exam: Vulva:  normal female genitalia Vagina:  normal vagina, no discharge, exudate, lesion, or erythema Cervix:  Non-tender, Negative CMT, no lesions or redness. Uterus:  normal shape, position and consistency    Procedure:  Speculum inserted.   Cervix visualized and cleansed with Betadine x 3.  Tenaculum placed on anterior cervix. IUD removed with forceps easily, then uterus sounded to 8 cm. IUD inserted easily. Strings trimmed to 2 cm.  Minimal bleeding noted.  Pt tolerated the procedure well.  Chaperone present: Raynelle Fanning, CMA   Assessment/Plan: 1. Encounter for removal and reinsertion of intrauterine contraceptive device (IUD)  2. Encounter for preprocedural laboratory examination - Pregnancy, urine    Return for recheck 4-6 weeks Pt aware to call for any concerns Pt aware removal due no later than 04/24/31, IUD card given to pt.   Arlie Solomons B WHNP-BC, 4:05 PM 04/24/2023

## 2023-04-25 LAB — SURESWAB CT/NG/T. VAGINALIS
C. trachomatis RNA, TMA: NOT DETECTED
N. gonorrhoeae RNA, TMA: NOT DETECTED
Trichomonas vaginalis RNA: NOT DETECTED

## 2023-06-12 ENCOUNTER — Other Ambulatory Visit (HOSPITAL_COMMUNITY): Payer: Self-pay

## 2023-06-13 ENCOUNTER — Other Ambulatory Visit (HOSPITAL_COMMUNITY): Payer: Self-pay

## 2023-06-18 ENCOUNTER — Other Ambulatory Visit: Payer: Self-pay

## 2023-06-19 ENCOUNTER — Other Ambulatory Visit (HOSPITAL_COMMUNITY): Payer: Self-pay

## 2023-09-30 ENCOUNTER — Other Ambulatory Visit (HOSPITAL_COMMUNITY): Payer: Self-pay

## 2023-10-01 ENCOUNTER — Other Ambulatory Visit (HOSPITAL_COMMUNITY): Payer: Self-pay

## 2023-10-01 ENCOUNTER — Other Ambulatory Visit: Payer: Self-pay

## 2023-10-04 ENCOUNTER — Other Ambulatory Visit: Payer: Self-pay

## 2023-10-08 ENCOUNTER — Other Ambulatory Visit (HOSPITAL_COMMUNITY): Payer: Self-pay

## 2023-10-08 ENCOUNTER — Other Ambulatory Visit: Payer: Self-pay

## 2023-10-10 ENCOUNTER — Other Ambulatory Visit (HOSPITAL_COMMUNITY): Payer: Self-pay

## 2023-10-25 DIAGNOSIS — Z862 Personal history of diseases of the blood and blood-forming organs and certain disorders involving the immune mechanism: Secondary | ICD-10-CM | POA: Diagnosis not present

## 2023-10-25 DIAGNOSIS — M7061 Trochanteric bursitis, right hip: Secondary | ICD-10-CM | POA: Diagnosis not present

## 2023-10-25 DIAGNOSIS — Z6834 Body mass index (BMI) 34.0-34.9, adult: Secondary | ICD-10-CM | POA: Diagnosis not present

## 2023-10-25 DIAGNOSIS — R5383 Other fatigue: Secondary | ICD-10-CM | POA: Diagnosis not present

## 2023-10-25 DIAGNOSIS — R030 Elevated blood-pressure reading, without diagnosis of hypertension: Secondary | ICD-10-CM | POA: Diagnosis not present

## 2023-10-25 DIAGNOSIS — F411 Generalized anxiety disorder: Secondary | ICD-10-CM | POA: Diagnosis not present

## 2023-11-19 DIAGNOSIS — G5761 Lesion of plantar nerve, right lower limb: Secondary | ICD-10-CM | POA: Diagnosis not present

## 2023-11-19 DIAGNOSIS — M79671 Pain in right foot: Secondary | ICD-10-CM | POA: Diagnosis not present

## 2023-11-19 DIAGNOSIS — M79672 Pain in left foot: Secondary | ICD-10-CM | POA: Diagnosis not present

## 2023-11-19 DIAGNOSIS — M722 Plantar fascial fibromatosis: Secondary | ICD-10-CM | POA: Diagnosis not present

## 2023-12-16 ENCOUNTER — Other Ambulatory Visit (HOSPITAL_COMMUNITY): Payer: Self-pay

## 2023-12-16 MED ORDER — CITALOPRAM HYDROBROMIDE 40 MG PO TABS
60.0000 mg | ORAL_TABLET | Freq: Every day | ORAL | 2 refills | Status: DC
  Filled 2023-12-16 – 2023-12-26 (×4): qty 135, 90d supply, fill #0
  Filled 2024-04-02: qty 135, 90d supply, fill #1
  Filled 2024-07-01: qty 135, 90d supply, fill #2

## 2023-12-17 ENCOUNTER — Other Ambulatory Visit (HOSPITAL_COMMUNITY): Payer: Self-pay

## 2023-12-18 ENCOUNTER — Other Ambulatory Visit (HOSPITAL_COMMUNITY): Payer: Self-pay

## 2023-12-19 ENCOUNTER — Other Ambulatory Visit (HOSPITAL_COMMUNITY): Payer: Self-pay

## 2023-12-19 ENCOUNTER — Other Ambulatory Visit: Payer: Self-pay

## 2023-12-24 ENCOUNTER — Other Ambulatory Visit: Payer: Self-pay

## 2023-12-26 ENCOUNTER — Other Ambulatory Visit: Payer: Self-pay

## 2023-12-26 ENCOUNTER — Other Ambulatory Visit (HOSPITAL_COMMUNITY): Payer: Self-pay

## 2024-01-10 ENCOUNTER — Other Ambulatory Visit (HOSPITAL_COMMUNITY): Payer: Self-pay | Admitting: Radiology

## 2024-01-10 DIAGNOSIS — Z1231 Encounter for screening mammogram for malignant neoplasm of breast: Secondary | ICD-10-CM

## 2024-01-13 ENCOUNTER — Encounter (HOSPITAL_COMMUNITY): Payer: Self-pay

## 2024-01-13 ENCOUNTER — Ambulatory Visit (HOSPITAL_COMMUNITY)
Admission: RE | Admit: 2024-01-13 | Discharge: 2024-01-13 | Disposition: A | Discharge status: Home / Self Care | Source: Ambulatory Visit | Attending: Radiology | Admitting: Radiology

## 2024-01-13 DIAGNOSIS — Z1231 Encounter for screening mammogram for malignant neoplasm of breast: Secondary | ICD-10-CM | POA: Insufficient documentation

## 2024-01-23 ENCOUNTER — Other Ambulatory Visit: Payer: Self-pay | Admitting: Oncology

## 2024-01-23 MED ORDER — AZITHROMYCIN 250 MG PO TABS: ORAL_TABLET | ORAL | 0 refills | Status: DC

## 2024-02-03 DIAGNOSIS — H35412 Lattice degeneration of retina, left eye: Secondary | ICD-10-CM | POA: Diagnosis not present

## 2024-02-21 DIAGNOSIS — L538 Other specified erythematous conditions: Secondary | ICD-10-CM | POA: Diagnosis not present

## 2024-02-21 DIAGNOSIS — Z08 Encounter for follow-up examination after completed treatment for malignant neoplasm: Secondary | ICD-10-CM | POA: Diagnosis not present

## 2024-02-21 DIAGNOSIS — L570 Actinic keratosis: Secondary | ICD-10-CM | POA: Diagnosis not present

## 2024-02-21 DIAGNOSIS — L821 Other seborrheic keratosis: Secondary | ICD-10-CM | POA: Diagnosis not present

## 2024-02-21 DIAGNOSIS — L718 Other rosacea: Secondary | ICD-10-CM | POA: Diagnosis not present

## 2024-02-21 DIAGNOSIS — Z85828 Personal history of other malignant neoplasm of skin: Secondary | ICD-10-CM | POA: Diagnosis not present

## 2024-02-21 DIAGNOSIS — D2372 Other benign neoplasm of skin of left lower limb, including hip: Secondary | ICD-10-CM | POA: Diagnosis not present

## 2024-02-21 DIAGNOSIS — L820 Inflamed seborrheic keratosis: Secondary | ICD-10-CM | POA: Diagnosis not present

## 2024-02-21 DIAGNOSIS — L814 Other melanin hyperpigmentation: Secondary | ICD-10-CM | POA: Diagnosis not present

## 2024-03-23 ENCOUNTER — Encounter: Payer: Self-pay | Admitting: Internal Medicine

## 2024-03-23 ENCOUNTER — Encounter: Payer: Self-pay | Admitting: Pain Medicine

## 2024-04-02 ENCOUNTER — Other Ambulatory Visit (HOSPITAL_COMMUNITY): Payer: Self-pay

## 2024-05-04 ENCOUNTER — Other Ambulatory Visit (HOSPITAL_COMMUNITY): Payer: Self-pay

## 2024-05-19 ENCOUNTER — Other Ambulatory Visit: Payer: Self-pay | Admitting: Oncology

## 2024-05-19 MED ORDER — PREDNISONE 20 MG PO TABS: 60.0000 mg | ORAL_TABLET | Freq: Every day | ORAL | 0 refills | Status: AC

## 2024-07-01 ENCOUNTER — Other Ambulatory Visit (HOSPITAL_COMMUNITY): Payer: Self-pay

## 2024-07-31 ENCOUNTER — Other Ambulatory Visit (HOSPITAL_BASED_OUTPATIENT_CLINIC_OR_DEPARTMENT_OTHER): Payer: Self-pay

## 2024-08-25 DIAGNOSIS — D485 Neoplasm of uncertain behavior of skin: Secondary | ICD-10-CM | POA: Diagnosis not present

## 2024-08-25 DIAGNOSIS — L718 Other rosacea: Secondary | ICD-10-CM | POA: Diagnosis not present

## 2024-08-25 DIAGNOSIS — L538 Other specified erythematous conditions: Secondary | ICD-10-CM | POA: Diagnosis not present

## 2024-08-25 DIAGNOSIS — C44712 Basal cell carcinoma of skin of right lower limb, including hip: Secondary | ICD-10-CM | POA: Diagnosis not present

## 2024-08-25 DIAGNOSIS — L578 Other skin changes due to chronic exposure to nonionizing radiation: Secondary | ICD-10-CM | POA: Diagnosis not present

## 2024-08-25 DIAGNOSIS — L820 Inflamed seborrheic keratosis: Secondary | ICD-10-CM | POA: Diagnosis not present

## 2024-08-25 DIAGNOSIS — L570 Actinic keratosis: Secondary | ICD-10-CM | POA: Diagnosis not present

## 2024-08-25 DIAGNOSIS — Z09 Encounter for follow-up examination after completed treatment for conditions other than malignant neoplasm: Secondary | ICD-10-CM | POA: Diagnosis not present

## 2024-09-01 ENCOUNTER — Ambulatory Visit: Admitting: Radiology

## 2024-09-01 ENCOUNTER — Encounter: Payer: Self-pay | Admitting: Radiology

## 2024-09-01 VITALS — BP 132/82 | HR 89 | Wt 200.0 lb

## 2024-09-01 DIAGNOSIS — R232 Flushing: Secondary | ICD-10-CM

## 2024-09-01 DIAGNOSIS — N939 Abnormal uterine and vaginal bleeding, unspecified: Secondary | ICD-10-CM

## 2024-09-01 LAB — ESTRADIOL: Estradiol: 86 pg/mL

## 2024-09-01 LAB — PREGNANCY, URINE: Preg Test, Ur: NEGATIVE

## 2024-09-01 LAB — FOLLICLE STIMULATING HORMONE: FSH: 9.4 m[IU]/mL

## 2024-09-01 NOTE — Progress Notes (Signed)
   Latasha Allen 1975/11/08 [id number]   History:  48 y.o. G2P2 complains of irregular dark vaginal bleeding with Mirena  x's 1+ year (after having Covid vaccination). Desires labs to check hormones (having episodes of feeling hot)   Gynecologic History No LMP recorded. (Menstrual status: IUD).   Contraception/Family planning: IUD placed 04/24/23 Sexually active: yes Last Pap: 01/08/23. Results were: normal   Obstetric History OB History  Gravida Para Term Preterm AB Living  2 2    2   SAB IAB Ectopic Multiple Live Births          # Outcome Date GA Lbr Len/2nd Weight Sex Type Anes PTL Lv  2 Para           1 Para               The following portions of the patient's history were reviewed and updated as appropriate: allergies, current medications, past family history, past medical history, past social history, past surgical history, and problem list.  Review of Systems  All other systems reviewed and are negative.   Past medical history, past surgical history, family history and social history were all reviewed and documented in the EPIC chart.  Exam:  Vitals:   09/01/24 0825  BP: 132/82  Pulse: 89  SpO2: 99%  Weight: 200 lb (90.7 kg)   Body mass index is 34.33 kg/m.  Physical Exam Vitals and nursing note reviewed. Exam conducted with a chaperone present.  Constitutional:      Appearance: Normal appearance. She is well-developed.  Pulmonary:     Effort: Pulmonary effort is normal.  Abdominal:     General: Abdomen is flat.     Palpations: Abdomen is soft.  Genitourinary:    General: Normal vulva.     Vagina: No vaginal discharge, erythema, bleeding or lesions.     Cervix: Normal. No discharge, friability, lesion or erythema.     Uterus: Normal.      Adnexa: Right adnexa normal and left adnexa normal.     Comments: IUD strings seen 2-3 cm from os Neurological:     Mental Status: She is alert.  Psychiatric:        Mood and Affect: Mood normal.        Thought  Content: Thought content normal.        Judgment: Judgment normal.      Darice Hoit, CMA present for exam  Assessment/Plan:   1. Vaginal bleeding, abnormal (Primary) - Pregnancy, urine; negative - FSH - Estradiol  - US  PELVIS TRANSVAGINAL NON-OB (TV ONLY); Future  2. Hot flashes - FSH - Estradiol    Will contact with results of labs testing and u/s  Lorry Anastasi B WHNP-BC 8:41 AM 09/01/2024

## 2024-09-02 ENCOUNTER — Ambulatory Visit: Payer: Self-pay | Admitting: Radiology

## 2024-09-16 ENCOUNTER — Other Ambulatory Visit (HOSPITAL_BASED_OUTPATIENT_CLINIC_OR_DEPARTMENT_OTHER): Payer: Self-pay

## 2024-09-16 ENCOUNTER — Other Ambulatory Visit: Payer: Self-pay

## 2024-09-16 MED ORDER — CITALOPRAM HYDROBROMIDE 40 MG PO TABS
60.0000 mg | ORAL_TABLET | Freq: Every day | ORAL | 3 refills | Status: AC
  Filled 2024-09-16: qty 135, 90d supply, fill #0

## 2024-09-17 ENCOUNTER — Other Ambulatory Visit: Admitting: Radiology

## 2024-09-17 ENCOUNTER — Other Ambulatory Visit

## 2024-10-27 ENCOUNTER — Other Ambulatory Visit: Admitting: Radiology

## 2024-10-27 ENCOUNTER — Other Ambulatory Visit
